# Patient Record
Sex: Male | Born: 1970 | Race: White | Marital: Married | Smoking: Never smoker
Health system: Northeastern US, Academic
[De-identification: ages and names within clinical notes are randomized; demographics above are authoritative.]

## PROBLEM LIST (undated history)

## (undated) DIAGNOSIS — G4730 Sleep apnea, unspecified: Secondary | ICD-10-CM

## (undated) DIAGNOSIS — I10 Essential (primary) hypertension: Secondary | ICD-10-CM

## (undated) DIAGNOSIS — Z9989 Dependence on other enabling machines and devices: Secondary | ICD-10-CM

## (undated) DIAGNOSIS — K219 Gastro-esophageal reflux disease without esophagitis: Secondary | ICD-10-CM

## (undated) DIAGNOSIS — T7840XA Allergy, unspecified, initial encounter: Secondary | ICD-10-CM

## (undated) DIAGNOSIS — K589 Irritable bowel syndrome without diarrhea: Secondary | ICD-10-CM

## (undated) DIAGNOSIS — E785 Hyperlipidemia, unspecified: Secondary | ICD-10-CM

## (undated) DIAGNOSIS — G4733 Obstructive sleep apnea (adult) (pediatric): Secondary | ICD-10-CM

## (undated) DIAGNOSIS — D126 Benign neoplasm of colon, unspecified: Secondary | ICD-10-CM

## (undated) DIAGNOSIS — I2510 Atherosclerotic heart disease of native coronary artery without angina pectoris: Secondary | ICD-10-CM

## (undated) DIAGNOSIS — R918 Other nonspecific abnormal finding of lung field: Secondary | ICD-10-CM

## (undated) DIAGNOSIS — R0989 Other specified symptoms and signs involving the circulatory and respiratory systems: Secondary | ICD-10-CM

## (undated) DIAGNOSIS — R197 Diarrhea, unspecified: Secondary | ICD-10-CM

## (undated) DIAGNOSIS — E7800 Pure hypercholesterolemia, unspecified: Secondary | ICD-10-CM

## (undated) HISTORY — PX: WISDOM TOOTH EXTRACTION: SHX21

## (undated) HISTORY — DX: Benign neoplasm of colon, unspecified: D12.6

## (undated) HISTORY — DX: Irritable bowel syndrome, unspecified: K58.9

## (undated) HISTORY — DX: Obstructive sleep apnea (adult) (pediatric): G47.33

## (undated) HISTORY — DX: Gastro-esophageal reflux disease without esophagitis: K21.9

## (undated) HISTORY — DX: Dependence on other enabling machines and devices: Z99.89

## (undated) HISTORY — DX: Hyperlipidemia, unspecified: E78.5

## (undated) HISTORY — DX: Allergy, unspecified, initial encounter: T78.40XA

## (undated) HISTORY — DX: Essential (primary) hypertension: I10

## (undated) HISTORY — DX: Sleep apnea, unspecified: G47.30

---

## 2000-01-09 HISTORY — PX: HIP SURGERY: SHX245

## 2008-10-17 HISTORY — PX: CERVICAL FUSION: SHX112

## 2009-10-17 HISTORY — PX: CERVICAL FUSION: SHX112

## 2009-10-18 ENCOUNTER — Ambulatory Visit (HOSPITAL_COMMUNITY): Admission: RE | Admit: 2009-10-18 | Discharge: 2009-10-19 | Payer: Self-pay | Admitting: Neurosurgery

## 2009-11-02 ENCOUNTER — Encounter: Admission: RE | Admit: 2009-11-02 | Discharge: 2009-11-02 | Payer: Self-pay | Admitting: Neurosurgery

## 2010-03-23 LAB — BASIC METABOLIC PANEL
CO2: 28 mEq/L (ref 19–32)
Chloride: 107 mEq/L (ref 96–112)
Creatinine, Ser: 0.9 mg/dL (ref 0.4–1.5)
GFR calc Af Amer: >60 mL/min (ref >60)
Potassium: 4.8 mEq/L (ref 3.5–5.1)
Sodium: 141 mEq/L (ref 135–145)

## 2010-03-23 LAB — CBC
Hemoglobin: 15.5 g/dL (ref 13.0–17.0)
MCH: 33 pg (ref 26.0–34.0)
MCV: 93 fL (ref 78.0–100.0)
Platelets: 266 10*3/uL (ref 150–400)
RBC: 4.69 MIL/uL (ref 4.22–5.81)
WBC: 7.5 10*3/uL (ref 4.0–10.5)

## 2010-07-25 ENCOUNTER — Encounter: Payer: Self-pay | Admitting: *Deleted

## 2010-07-25 ENCOUNTER — Emergency Department (HOSPITAL_COMMUNITY): Payer: PRIVATE HEALTH INSURANCE

## 2010-07-25 ENCOUNTER — Emergency Department (HOSPITAL_COMMUNITY)
Admission: EM | Admit: 2010-07-25 | Discharge: 2010-07-25 | Disposition: A | Discharge status: Home / Self Care | Payer: PRIVATE HEALTH INSURANCE | Attending: Emergency Medicine | Admitting: Emergency Medicine

## 2010-07-25 DIAGNOSIS — Z87891 Personal history of nicotine dependence: Secondary | ICD-10-CM | POA: Insufficient documentation

## 2010-07-25 DIAGNOSIS — M25461 Effusion, right knee: Secondary | ICD-10-CM

## 2010-07-25 DIAGNOSIS — M25469 Effusion, unspecified knee: Secondary | ICD-10-CM | POA: Insufficient documentation

## 2010-07-25 DIAGNOSIS — R52 Pain, unspecified: Secondary | ICD-10-CM

## 2010-07-25 DIAGNOSIS — Z809 Family history of malignant neoplasm, unspecified: Secondary | ICD-10-CM | POA: Insufficient documentation

## 2010-07-25 MED ORDER — HYDROCODONE-ACETAMINOPHEN 5-325 MG PO TABS: 1.0000 | ORAL_TABLET | ORAL | Status: AC | PRN

## 2010-07-25 MED ORDER — IBUPROFEN 800 MG PO TABS
800.0000 mg | ORAL_TABLET | Freq: Once | ORAL | Status: AC
  Administered 2010-07-25: 800 mg via ORAL
  Filled 2010-07-25: qty 1

## 2010-07-25 MED ORDER — HYDROCODONE-ACETAMINOPHEN 5-325 MG PO TABS
1.0000 | ORAL_TABLET | Freq: Once | ORAL | Status: AC
  Administered 2010-07-25: 1 via ORAL
  Filled 2010-07-25: qty 1

## 2010-07-25 MED ORDER — IBUPROFEN 800 MG PO TABS: 800.0000 mg | ORAL_TABLET | Freq: Three times a day (TID) | ORAL | Status: AC | PRN

## 2010-07-25 NOTE — ED Notes (Signed)
Pt c/o pain and swelling to his rt knee since yesterday. Denies injury. Pt states that he injured his knee years ago and about once a year it acts up.

## 2010-07-25 NOTE — ED Provider Notes (Signed)
History     Chief Complaint  Patient presents with  . Knee Pain   Patient is a 40 y.o. male presenting with knee pain. The history is provided by the patient.  Knee Pain This is a recurrent problem. The current episode started yesterday. The problem occurs constantly. The problem has been unchanged. Associated symptoms include arthralgias and joint swelling. Pertinent negatives include no abdominal pain, chest pain, congestion, fever, headaches, nausea, neck pain, numbness, rash, sore throat, vomiting or weakness. The symptoms are aggravated by bending and walking. He has tried nothing for the symptoms.    History reviewed. No pertinent past medical history.  Past Surgical History  Procedure Date  . Cervical fusion   . Hip surgery     lipoma removed    Family History  Problem Relation Age of Onset  . Cancer Father   . Hypertension Father     History  Substance Use Topics  . Smoking status: Former Games developer  . Smokeless tobacco: Not on file  . Alcohol Use: 2.4 oz/week    4 Cans of beer per week      Review of Systems  Constitutional: Negative for fever.  HENT: Negative for congestion, sore throat and neck pain.   Eyes: Negative.   Respiratory: Negative for chest tightness and shortness of breath.   Cardiovascular: Negative for chest pain.  Gastrointestinal: Negative for nausea, vomiting and abdominal pain.  Genitourinary: Negative.   Musculoskeletal: Positive for joint swelling, arthralgias and gait problem.  Skin: Negative.  Negative for rash and wound.  Neurological: Negative for dizziness, weakness, light-headedness, numbness and headaches.  Hematological: Negative.   Psychiatric/Behavioral: Negative.     Physical Exam  BP 129/92  Pulse 90  Temp(Src) 98.5 F (36.9 C) (Oral)  Resp 18  Ht 5\' 9"  (1.753 m)  Wt 204 lb (92.534 kg)  BMI 30.13 kg/m2  SpO2 98%  Physical Exam  Vitals reviewed. Constitutional: He is oriented to person, place, and time. He appears  well-developed and well-nourished.  HENT:  Head: Normocephalic and atraumatic.  Eyes: Conjunctivae are normal.  Neck: Normal range of motion.  Cardiovascular: Normal rate, regular rhythm, normal heart sounds and intact distal pulses.   Pulmonary/Chest: Effort normal and breath sounds normal. He has no wheezes.  Abdominal: Soft. Bowel sounds are normal. There is no tenderness.  Musculoskeletal:       Right knee: He exhibits decreased range of motion, swelling and effusion. He exhibits no deformity, no erythema, no LCL laxity and no MCL laxity. tenderness found.  Neurological: He is alert and oriented to person, place, and time.  Skin: Skin is warm and dry.  Psychiatric: He has a normal mood and affect.    ED Course  Procedures  MDM Crutches,  Knee immobilizer provided by RN.      Candis Musa, PA 07/25/10 1453

## 2010-07-26 NOTE — ED Provider Notes (Signed)
Medical screening examination/treatment/procedure(s) were performed by non-physician practitioner and as supervising physician I was immediately available for consultation/collaboration.   Aleksandr Pellow M Love Milbourne, MD 07/26/10 0629 

## 2010-08-08 ENCOUNTER — Ambulatory Visit (INDEPENDENT_AMBULATORY_CARE_PROVIDER_SITE_OTHER): Payer: PRIVATE HEALTH INSURANCE | Admitting: Orthopedic Surgery

## 2010-08-08 DIAGNOSIS — M25469 Effusion, unspecified knee: Secondary | ICD-10-CM

## 2010-08-08 DIAGNOSIS — M25569 Pain in unspecified knee: Secondary | ICD-10-CM

## 2010-08-08 DIAGNOSIS — M238X9 Other internal derangements of unspecified knee: Secondary | ICD-10-CM

## 2010-08-08 DIAGNOSIS — M23329 Other meniscus derangements, posterior horn of medial meniscus, unspecified knee: Secondary | ICD-10-CM

## 2010-08-08 DIAGNOSIS — M25462 Effusion, left knee: Secondary | ICD-10-CM

## 2010-08-08 DIAGNOSIS — M2420 Disorder of ligament, unspecified site: Secondary | ICD-10-CM

## 2010-08-08 NOTE — Patient Instructions (Signed)
You have received a steroid shot. 15% of patients experience increased pain at the injection site with in the next 24 hours. This is best treated with ice and tylenol extra strength 2 tabs every 8 hours. If you are still having pain please call the office.    

## 2010-08-09 ENCOUNTER — Encounter: Payer: Self-pay | Admitting: Orthopedic Surgery

## 2010-08-09 DIAGNOSIS — M25462 Effusion, left knee: Secondary | ICD-10-CM | POA: Insufficient documentation

## 2010-08-09 DIAGNOSIS — M238X9 Other internal derangements of unspecified knee: Secondary | ICD-10-CM | POA: Insufficient documentation

## 2010-08-09 DIAGNOSIS — M25569 Pain in unspecified knee: Secondary | ICD-10-CM | POA: Insufficient documentation

## 2010-08-09 MED ORDER — METHYLPREDNISOLONE ACETATE 40 MG/ML IJ SUSP: 40.0000 mg | Freq: Once | INTRAMUSCULAR | Status: DC

## 2010-08-09 NOTE — Progress Notes (Signed)
   New patient  Chief complaint RIGHT knee pain and swelling  This is a 40 year old male who injured his RIGHT knee 6-7 years ago jumping off a truck with 100 pound sack of corn on his shoulders.  He felt immediate pain and swelling in his knee "blew up" he was unable to move it for several days and eventually his symptoms resolved and he went back to his normal activities.  However, since that time he will have periodic swelling of the knee and loss of motion.  It usually with rest and anti-inflammatories his symptoms will resolve in a couple of days.  However on this occasion he had to go to the emergency room because his symptoms did not get better and he's had pain and swelling and symptoms of giving out of the knee over the last 2 weeks  Review of systems respiratory he reports frequent and intermittent cough, musculoskeletal as stated, neurologic occasional dizziness.  Cardiovascular GI and GU symptoms are negative  Physical examination Vital signs are stable as recorded  General appearance is normal  The patient is alert and oriented x3  The patient's mood and affect are normal  Gait assessment: limping The cardiovascular exam reveals normal pulses and temperature without edema swelling.  The lymphatic system is negative for palpable lymph nodes  The sensory exam is normal.  There are no pathologic reflexes.  Balance is normal.   Exam of the right knee Inspection swelling of the knee  Range of motion 5-125 Stability ? Feels lax but pain and swelling limit exam  Strength normal Skin normal   Left knee exam is normal   xrays of the knee are from [facility]: they are normal     Knee Arthrocentesis with Injection Procedure Note  Pre-operative Diagnosis: right knee effusion  Post-operative Diagnosis: same  Indications: Unexplained joint effusion and Diagnostic  Anesthesia:ethyl chloride  Procedure Details   Verbal consent was obtained for the procedure. The joint was  prepped with Betadine and a small wheel of anesthetic was injected into the subcutaneous tissue. A 18 gauge needle was inserted into the superior aspect of the joint from a lateral approach. 0 ml of  fluid was removed from the joint. 4 ml 1% lidocaine and 1 ml of depomedrol was then injected into the joint through the same needle. The needle was removed and the area cleansed and dressed.  Complications:  None; patient tolerated the procedure well.

## 2010-08-18 ENCOUNTER — Telehealth: Payer: Self-pay | Admitting: Radiology

## 2010-08-18 NOTE — Telephone Encounter (Signed)
I called and left a message for the patient with his MRI appointment at [facility] on 09-01-10 at 9:45. Patient has [employer], authorization # [id number] and it expires on 09-04-10. Patient will follow up back here in our office.

## 2010-08-22 ENCOUNTER — Ambulatory Visit: Payer: PRIVATE HEALTH INSURANCE | Admitting: Orthopedic Surgery

## 2010-09-01 ENCOUNTER — Ambulatory Visit (HOSPITAL_COMMUNITY)
Admission: RE | Admit: 2010-09-01 | Discharge: 2010-09-01 | Disposition: A | Discharge status: Home / Self Care | Payer: PRIVATE HEALTH INSURANCE | Source: Ambulatory Visit | Attending: Orthopedic Surgery | Admitting: Orthopedic Surgery

## 2010-09-01 DIAGNOSIS — M25469 Effusion, unspecified knee: Secondary | ICD-10-CM | POA: Insufficient documentation

## 2010-09-01 DIAGNOSIS — M898X9 Other specified disorders of bone, unspecified site: Secondary | ICD-10-CM | POA: Insufficient documentation

## 2010-09-01 DIAGNOSIS — M25569 Pain in unspecified knee: Secondary | ICD-10-CM | POA: Insufficient documentation

## 2010-09-07 ENCOUNTER — Encounter: Payer: Self-pay | Admitting: Orthopedic Surgery

## 2010-09-07 ENCOUNTER — Ambulatory Visit: Payer: PRIVATE HEALTH INSURANCE | Admitting: Orthopedic Surgery

## 2010-09-21 ENCOUNTER — Ambulatory Visit (INDEPENDENT_AMBULATORY_CARE_PROVIDER_SITE_OTHER): Payer: PRIVATE HEALTH INSURANCE | Admitting: Orthopedic Surgery

## 2010-09-21 ENCOUNTER — Encounter: Payer: Self-pay | Admitting: Orthopedic Surgery

## 2010-09-21 DIAGNOSIS — M25569 Pain in unspecified knee: Secondary | ICD-10-CM

## 2010-09-21 NOTE — Progress Notes (Signed)
RIGHT knee pain  Recurrent swelling  Send patient for MRI MRI normal  Discussed this with patient  I am a shot or oral anti-inflammatory.  He requested some Vicodin along with the anti-inflammatory.  We gave him this medication will come back at any time follow up as needed  MRI was reviewed and the report was reviewed and the report was normal and I agree that it was normal

## 2010-09-21 NOTE — Patient Instructions (Signed)
Take medication as needed  

## 2011-12-18 ENCOUNTER — Encounter: Payer: Self-pay | Admitting: Gastroenterology

## 2011-12-27 ENCOUNTER — Ambulatory Visit: Payer: Managed Care, Other (non HMO) | Attending: Physician Assistant | Admitting: Sleep Medicine

## 2011-12-27 ENCOUNTER — Other Ambulatory Visit: Payer: Self-pay

## 2011-12-27 VITALS — Ht 69.0 in | Wt 206.0 lb

## 2011-12-27 DIAGNOSIS — G4730 Sleep apnea, unspecified: Secondary | ICD-10-CM | POA: Insufficient documentation

## 2011-12-28 NOTE — Procedures (Signed)
[facility] Nell Schrack A. Gerilyn Pilgrim, MD     [email or web address]        John Knox, John Knox             ACCOUNT NO.:  [id number]  MEDICAL RECORD NO.:  [id number]          PATIENT TYPE:  OUT  LOCATION:  SLEEP LAB                     FACILITY:  [facility]  PHYSICIAN:  Audery Wassenaar A. Gerilyn Pilgrim, M.D. DATE OF BIRTH:  31-Aug-1970  DATE OF STUDY:  12/27/2011                           NOCTURNAL POLYSOMNOGRAM  REFERRING PHYSICIAN:  Horald Pollen  REFERRING PROVIDER:  Helene Kelp, PA-C  INDICATION:  A 41 year old presents with hypersomnia, fatigue, and snoring.  The study is being done to evaluate for obstructive sleep apnea syndrome.  MEDICATIONS:  Crestor.  EPWORTH SLEEPINESS SCALE:  7.  BMI:  30.  ARCHITECTURAL SUMMARY:  Total recording time is 361 minutes.  Sleep efficiency 91%.  Sleep latency 4.5 minutes.  REM latency 85 minutes. This is a split night recording with initial portion being a diagnostic and second portion a titration recording.  RESPIRATORY SUMMARY:  Baseline oxygen saturation 97%, lowest saturation 80% during non-REM sleep.  Diagnostic AHI is 27.  The patient was placed on positive pressures between 6 and 12.  Optimal pressure is 12 with resolution of obstructive events and snoring.  LIMB MOVEMENT SUMMARY:  PLM index 0.  ELECTROCARDIOGRAM SUMMARY:  Average heart rate is 76 with no significant dysrhythmias observed.  IMPRESSION:  Moderate obstructive sleep apnea syndrome, which responds well to a CPAP of 12.  Thanks for this referral.    Kaavya Puskarich A. Gerilyn Pilgrim, M.D.    KAD/MEDQ  D:  12/28/2011 10:38:42  T:  12/28/2011 10:49:20  Job:  [id number]

## 2012-01-08 ENCOUNTER — Ambulatory Visit (AMBULATORY_SURGERY_CENTER): Payer: Managed Care, Other (non HMO)

## 2012-01-08 VITALS — Ht 69.0 in | Wt 211.0 lb

## 2012-01-08 DIAGNOSIS — Z800 Family history of malignant neoplasm of digestive organs: Secondary | ICD-10-CM

## 2012-01-08 DIAGNOSIS — Z1211 Encounter for screening for malignant neoplasm of colon: Secondary | ICD-10-CM

## 2012-01-08 MED ORDER — MOVIPREP 100 G PO SOLR: ORAL | Status: DC

## 2012-01-10 ENCOUNTER — Encounter: Payer: Self-pay | Admitting: Gastroenterology

## 2012-01-23 ENCOUNTER — Encounter: Payer: Self-pay | Admitting: Gastroenterology

## 2012-01-23 ENCOUNTER — Ambulatory Visit (AMBULATORY_SURGERY_CENTER): Payer: Managed Care, Other (non HMO) | Admitting: Gastroenterology

## 2012-01-23 VITALS — BP 120/57 | HR 82 | Temp 97.2°F | Resp 14 | Ht 69.0 in | Wt 211.0 lb

## 2012-01-23 DIAGNOSIS — D126 Benign neoplasm of colon, unspecified: Secondary | ICD-10-CM

## 2012-01-23 DIAGNOSIS — Z1211 Encounter for screening for malignant neoplasm of colon: Secondary | ICD-10-CM

## 2012-01-23 DIAGNOSIS — Z800 Family history of malignant neoplasm of digestive organs: Secondary | ICD-10-CM

## 2012-01-23 MED ORDER — SODIUM CHLORIDE 0.9 % IV SOLN: 500.0000 mL | INTRAVENOUS | Status: DC

## 2012-01-23 NOTE — Progress Notes (Signed)
Called to room to assist during endoscopic procedure.  Patient ID and intended procedure confirmed with present staff. Received instructions for my participation in the procedure from the performing physician. ewm 

## 2012-01-23 NOTE — Progress Notes (Signed)
Patient did not experience any of the following events: a burn prior to discharge; a fall within the facility; wrong site/side/patient/procedure/implant event; or a hospital transfer or hospital admission upon discharge from the facility. (G8907) Patient did not have preoperative order for IV antibiotic SSI prophylaxis. (G8918)  

## 2012-01-23 NOTE — Op Note (Signed)
[facility] [address] [place], [address]   COLONOSCOPY PROCEDURE REPORT  PATIENT: John Knox, John Knox  MR#: [id number] BIRTHDATE: 1971-01-01 , 41  yrs. old GENDER: Male ENDOSCOPIST: Mardella Layman, MD, [facility] REFERRED BY:  Rudi Heap, M.D. PROCEDURE DATE:  01/23/2012 PROCEDURE:   Colonoscopy with snare polypectomy ASA CLASS:   Class II INDICATIONS:Patient's immediate family history of colon cancer. MEDICATIONS: propofol (Diprivan) 350mg  IV  DESCRIPTION OF PROCEDURE:   After the risks and benefits and of the procedure were explained, informed consent was obtained.  A digital rectal exam revealed no abnormalities of the rectum.    The LB CF-[id number]  endoscope was introduced through the anus and advanced to the cecum, which was identified by both the appendix and ileocecal valve .  The quality of the prep was excellent, using MoviPrep .  The instrument was then slowly withdrawn as the colon was fully examined.     COLON FINDINGS: A fungating pedunculated polyp ranging between 5-45mm in size was found in the sigmoid colon.  A polypectomy was performed using snare cautery.  The resection was complete and the polyp tissue was completely retrieved.   A diminutive smooth flat polyp was found in the rectum.  A polypectomy was performed using snare cautery.  The resection was complete and the polyp tissue was not retrieved. Other wise normal exam.    Retroflexed views revealed no abnormalities.     The scope was then withdrawn from the patient and the procedure completed.  COMPLICATIONS: There were no complications. ENDOSCOPIC IMPRESSION: 1.   Pedunculated polyp ranging between 5-16mm in size was found in the sigmoid colon; polypectomy was performed using snare cautery 2.   Diminutive flat polyp was found in the rectum; polypectomy was performed using snare cautery  RECOMMENDATIONS:  1  Await pathology results 2.  Repeat Colonoscopy in 3  years.   REPEAT EXAM:  cc:  _______________________________ eSignedMardella Layman, MD, [facility] 01/23/2012 2:42 PM

## 2012-01-23 NOTE — Progress Notes (Signed)
Lidocaine-40mg IV prior to Propofol InductionPropofol given over incremental dosages 

## 2012-01-23 NOTE — Patient Instructions (Addendum)
Discharge instructions given with verbal understanding. Handout on polyps given. Resume previous medications. YOU HAD AN ENDOSCOPIC PROCEDURE TODAY AT THE [facility]: Refer to the procedure report that was given to you for any specific questions about what was found during the examination.  If the procedure report does not answer your questions, please call your gastroenterologist to clarify.  If you requested that your care partner not be given the details of your procedure findings, then the procedure report has been included in a sealed envelope for you to review at your convenience later.  YOU SHOULD EXPECT: Some feelings of bloating in the abdomen. Passage of more gas than usual.  Walking can help get rid of the air that was put into your GI tract during the procedure and reduce the bloating. If you had a lower endoscopy (such as a colonoscopy or flexible sigmoidoscopy) you may notice spotting of blood in your stool or on the toilet paper. If you underwent a bowel prep for your procedure, then you may not have a normal bowel movement for a few days.  DIET: Your first meal following the procedure should be a light meal and then it is ok to progress to your normal diet.  A half-sandwich or bowl of soup is an example of a good first meal.  Heavy or fried foods are harder to digest and may make you feel nauseous or bloated.  Likewise meals heavy in dairy and vegetables can cause extra gas to form and this can also increase the bloating.  Drink plenty of fluids but you should avoid alcoholic beverages for 24 hours.  ACTIVITY: Your care partner should take you home directly after the procedure.  You should plan to take it easy, moving slowly for the rest of the day.  You can resume normal activity the day after the procedure however you should NOT DRIVE or use heavy machinery for 24 hours (because of the sedation medicines used during the test).    SYMPTOMS TO REPORT IMMEDIATELY: A  gastroenterologist can be reached at any hour.  During normal business hours, 8:30 AM to 5:00 PM Monday through Friday, call [phone].  After hours and on weekends, please call the GI answering service at [phone] who will take a message and have the physician on call contact you.   Following lower endoscopy (colonoscopy or flexible sigmoidoscopy):  Excessive amounts of blood in the stool  Significant tenderness or worsening of abdominal pains  Swelling of the abdomen that is new, acute  Fever of 100F or higher  FOLLOW UP: If any biopsies were taken you will be contacted by phone or by letter within the next 1-3 weeks.  Call your gastroenterologist if you have not heard about the biopsies in 3 weeks.  Our staff will call the home number listed on your records the next business day following your procedure to check on you and address any questions or concerns that you may have at that time regarding the information given to you following your procedure. This is a courtesy call and so if there is no answer at the home number and we have not heard from you through the emergency physician on call, we will assume that you have returned to your regular daily activities without incident.  SIGNATURES/CONFIDENTIALITY: You and/or your care partner have signed paperwork which will be entered into your electronic medical record.  These signatures attest to the fact that that the information above on your After Visit Summary has   been reviewed and is understood.  Full responsibility of the confidentiality of this discharge information lies with you and/or your care-partner. 

## 2012-01-24 ENCOUNTER — Telehealth: Payer: Self-pay

## 2012-01-24 NOTE — Telephone Encounter (Signed)
Left message on answering machine. 

## 2012-01-29 ENCOUNTER — Encounter: Payer: Self-pay | Admitting: Gastroenterology

## 2012-03-28 ENCOUNTER — Ambulatory Visit (INDEPENDENT_AMBULATORY_CARE_PROVIDER_SITE_OTHER): Payer: Managed Care, Other (non HMO) | Admitting: Nurse Practitioner

## 2012-03-28 ENCOUNTER — Encounter: Payer: Self-pay | Admitting: Nurse Practitioner

## 2012-03-28 VITALS — BP 134/96 | HR 85 | Temp 97.3°F | Ht 69.0 in | Wt 216.0 lb

## 2012-03-28 DIAGNOSIS — J329 Chronic sinusitis, unspecified: Secondary | ICD-10-CM

## 2012-03-28 MED ORDER — FLUTICASONE PROPIONATE 50 MCG/ACT NA SUSP: 2.0000 | Freq: Every day | NASAL | Status: DC

## 2012-03-28 NOTE — Patient Instructions (Addendum)
STOP AFRIN!  Sinusitis Sinusitis is redness, soreness, and swelling (inflammation) of the paranasal sinuses. Paranasal sinuses are air pockets within the bones of your face (beneath the eyes, the middle of the forehead, or above the eyes). In healthy paranasal sinuses, mucus is able to drain out, and air is able to circulate through them by way of your nose. However, when your paranasal sinuses are inflamed, mucus and air can become trapped. This can allow bacteria and other germs to grow and cause infection. Sinusitis can develop quickly and last only a short time (acute) or continue over a long period (chronic). Sinusitis that lasts for more than 12 weeks is considered chronic.  CAUSES  Causes of sinusitis include:  Allergies.  Structural abnormalities, such as displacement of the cartilage that separates your nostrils (deviated septum), which can decrease the air flow through your nose and sinuses and affect sinus drainage.  Functional abnormalities, such as when the small hairs (cilia) that line your sinuses and help remove mucus do not work properly or are not present. SYMPTOMS  Symptoms of acute and chronic sinusitis are the same. The primary symptoms are pain and pressure around the affected sinuses. Other symptoms include:  Upper toothache.  Earache.  Headache.  Bad breath.  Decreased sense of smell and taste.  A cough, which worsens when you are lying flat.  Fatigue.  Fever.  Thick drainage from your nose, which often is green and may contain pus (purulent).  Swelling and warmth over the affected sinuses. DIAGNOSIS  Your caregiver will perform a physical exam. During the exam, your caregiver may:  Look in your nose for signs of abnormal growths in your nostrils (nasal polyps).  Tap over the affected sinus to check for signs of infection.  View the inside of your sinuses (endoscopy) with a special imaging device with a light attached (endoscope), which is inserted  into your sinuses. If your caregiver suspects that you have chronic sinusitis, one or more of the following tests may be recommended:  Allergy tests.  Nasal culture A sample of mucus is taken from your nose and sent to a lab and screened for bacteria.  Nasal cytology A sample of mucus is taken from your nose and examined by your caregiver to determine if your sinusitis is related to an allergy. TREATMENT  Most cases of acute sinusitis are related to a viral infection and will resolve on their own within 10 days. Sometimes medicines are prescribed to help relieve symptoms (pain medicine, decongestants, nasal steroid sprays, or saline sprays).  However, for sinusitis related to a bacterial infection, your caregiver will prescribe antibiotic medicines. These are medicines that will help kill the bacteria causing the infection.  Rarely, sinusitis is caused by a fungal infection. In theses cases, your caregiver will prescribe antifungal medicine. For some cases of chronic sinusitis, surgery is needed. Generally, these are cases in which sinusitis recurs more than 3 times per year, despite other treatments. HOME CARE INSTRUCTIONS   Drink plenty of water. Water helps thin the mucus so your sinuses can drain more easily.  Use a humidifier.  Inhale steam 3 to 4 times a day (for example, sit in the bathroom with the shower running).  Apply a warm, moist washcloth to your face 3 to 4 times a day, or as directed by your caregiver.  Use saline nasal sprays to help moisten and clean your sinuses.  Take over-the-counter or prescription medicines for pain, discomfort, or fever only as directed by your caregiver.  SEEK IMMEDIATE MEDICAL CARE IF:  You have increasing pain or severe headaches.  You have nausea, vomiting, or drowsiness.  You have swelling around your face.  You have vision problems.  You have a stiff neck.  You have difficulty breathing. MAKE SURE YOU:   Understand these  instructions.  Will watch your condition.  Will get help right away if you are not doing well or get worse. Document Released: 12/25/2004 Document Revised: 03/19/2011 Document Reviewed: 01/09/2011 [facility] Patient Information 2013 [place], [place].

## 2012-03-28 NOTE — Progress Notes (Signed)
  Subjective:    Patient ID: John Knox, male    DOB: 1970-05-09, 42 y.o.   MRN: [id number]  HPI Patient in complaining of nasal congestion . Started 62month. Has gotten unchanged since started. Marland Kitchen He has tried Afrin OTC without relief    Review of Systems  Constitutional: Negative.   HENT: Positive for congestion.   Respiratory: Negative.   Cardiovascular: Negative.        Objective:   Physical Exam  Constitutional: He appears well-developed and well-nourished.  HENT:  Head: Normocephalic.  Right Ear: External ear normal.  Left Ear: External ear normal.  Nose: Mucosal edema present. Right sinus exhibits no maxillary sinus tenderness and no frontal sinus tenderness. Left sinus exhibits no maxillary sinus tenderness and no frontal sinus tenderness.  Tonsils 2+ bil  Cardiovascular: Normal rate, regular rhythm and normal heart sounds.   Pulmonary/Chest: Effort normal and breath sounds normal.  Skin: Skin is warm and dry.    BP 134/96  Pulse 85  Temp(Src) 97.3 F (36.3 C) (Oral)  Ht 5\' 9"  (1.753 m)  Wt 216 lb (97.977 kg)  BMI 31.88 kg/m2       Assessment & Plan:  Chronic Sinusitis  STOP AFRIN!  Force fluids  Zyrtec D OTC  Flonase nasal spray as Rx  Mary-Margaret Daphine Deutscher, FNP

## 2012-08-04 ENCOUNTER — Telehealth: Payer: Self-pay

## 2012-08-04 DIAGNOSIS — J329 Chronic sinusitis, unspecified: Secondary | ICD-10-CM

## 2012-08-04 NOTE — Telephone Encounter (Signed)
Wants a referral to ENT   Has same congestion as he came in for before  It is severe

## 2012-08-04 NOTE — Telephone Encounter (Signed)
referrral made to ENT

## 2012-08-05 NOTE — Telephone Encounter (Signed)
Patient aware.

## 2012-12-12 ENCOUNTER — Encounter: Payer: Self-pay | Admitting: Physician Assistant

## 2012-12-12 ENCOUNTER — Ambulatory Visit (INDEPENDENT_AMBULATORY_CARE_PROVIDER_SITE_OTHER): Payer: PRIVATE HEALTH INSURANCE | Admitting: Physician Assistant

## 2012-12-12 VITALS — BP 130/96 | HR 90 | Temp 98.6°F | Wt 219.8 lb

## 2012-12-12 DIAGNOSIS — J020 Streptococcal pharyngitis: Secondary | ICD-10-CM

## 2012-12-12 DIAGNOSIS — J029 Acute pharyngitis, unspecified: Secondary | ICD-10-CM

## 2012-12-12 DIAGNOSIS — J039 Acute tonsillitis, unspecified: Secondary | ICD-10-CM

## 2012-12-12 MED ORDER — METHYLPREDNISOLONE ACETATE 80 MG/ML IJ SUSP
80.0000 mg | Freq: Once | INTRAMUSCULAR | Status: AC
  Administered 2012-12-12: 80 mg via INTRAMUSCULAR

## 2012-12-12 MED ORDER — AMOXICILLIN 875 MG PO TABS: 875.0000 mg | ORAL_TABLET | Freq: Two times a day (BID) | ORAL | Status: DC

## 2012-12-12 NOTE — Patient Instructions (Addendum)
Drink plenty of fluids. After starting antibiotic therapy, you will be contagious for 24 hours. F/u in 1 week for reassessment of tonsils and possible referral to ENT.    Methylprednisolone Suspension for Injection What is this medicine? METHYLPREDNISOLONE (meth ill pred NISS oh lone) is a corticosteroid. It is commonly used to treat inflammation of the skin, joints, lungs, and other organs. Common conditions treated include asthma, allergies, and arthritis. It is also used for other conditions, such as blood disorders and diseases of the adrenal glands. This medicine may be used for other purposes; ask your health care provider or pharmacist if you have questions. COMMON BRAND NAME(S): Depmedalone-40, Depmedalone-80 , Depo-Medrol What should I tell my health care provider before I take this medicine? They need to know if you have any of these conditions: -cataracts or glaucoma -Cushings -heart disease -high blood pressure -infection including tuberculosis -low calcium or potassium levels in the blood -recent surgery -seizures -stomach or intestinal disease, including colitis -threadworms -thyroid problems -an unusual or allergic reaction to methylprednisolone, corticosteroids, benzyl alcohol, other medicines, foods, dyes, or preservatives -pregnant or trying to get pregnant -breast-feeding How should I use this medicine? This medicine is for injection into a muscle, joint, or other tissue. It is given by a health care professional in a hospital or clinic setting. Talk to your pediatrician regarding the use of this medicine in children. While this drug may be prescribed for selected conditions, precautions do apply. Overdosage: If you think you have taken too much of this medicine contact a poison control center or emergency room at once. NOTE: This medicine is only for you. Do not share this medicine with others. What if I miss a dose? This does not apply. What may interact with this  medicine? Do not take this medicine with any of the following medications: -mifepristone This medicine may also interact with the following medications: -aspirin and aspirin-like medicines -cyclosporin -ketoconazole -phenobarbital -phenytoin -rifampin -tacrolimus -troleandomycin -vaccines -warfarin This list may not describe all possible interactions. Give your health care provider a list of all the medicines, herbs, non-prescription drugs, or dietary supplements you use. Also tell them if you smoke, drink alcohol, or use illegal drugs. Some items may interact with your medicine. What should I watch for while using this medicine? Visit your doctor or health care professional for regular checks on your progress. If you are taking this medicine for a long time, carry an identification card with your name and address, the type and dose of your medicine, and your doctor's name and address. The medicine may increase your risk of getting an infection. Stay away from people who are sick. Tell your doctor or health care professional if you are around anyone with measles or chickenpox. You may need to avoid some vaccines. Talk to your health care provider for more information. If you are going to have surgery, tell your doctor or health care professional that you have taken this medicine within the last twelve months. Ask your doctor or health care professional about your diet. You may need to lower the amount of salt you eat. The medicine can increase your blood sugar. If you are a diabetic check with your doctor if you need help adjusting the dose of your diabetic medicine. What side effects may I notice from receiving this medicine? Side effects that you should report to your doctor or health care professional as soon as possible: -allergic reactions like skin rash, itching or hives, swelling of the face, lips, or  tongue -bloody or tarry stools -changes in vision -eye pain or bulging  eyes -fever, sore throat, sneezing, cough, or other signs of infection, wounds that will not heal -increased thirst -irregular heartbeat -muscle cramps -pain in hips, back, ribs, arms, shoulders, or legs -swelling of the ankles, feet, hands -trouble passing urine or change in the amount of urine -unusual bleeding or bruising -unusually weak or tired -weight gain or weight loss Side effects that usually do not require medical attention (report to your doctor or health care professional if they continue or are bothersome): -changes in emotions or moods -constipation or diarrhea -headache -irritation at site where injected -nausea, vomiting -skin problems, acne, thin and shiny skin -trouble sleeping -unusual hair growth on the face or body This list may not describe all possible side effects. Call your doctor for medical advice about side effects. You may report side effects to FDA at 1-800-FDA-1088. Where should I keep my medicine? This drug is given in a hospital or clinic and will not be stored at home. NOTE: This sheet is a summary. It may not cover all possible information. If you have questions about this medicine, talk to your doctor, pharmacist, or health care provider.  2014, Elsevier/Gold Standard. (2011-09-25 11:37:56)

## 2012-12-16 DIAGNOSIS — J039 Acute tonsillitis, unspecified: Secondary | ICD-10-CM | POA: Insufficient documentation

## 2012-12-16 NOTE — Progress Notes (Signed)
   Subjective:    Patient ID: John Knox, male    DOB: 1970/10/29, 42 y.o.   MRN: [id number]  HPI42 y/o male presents for sore throat x 3-4 days. Recently got back from a business trip and had possible exposure on the plane. No aggravating or relieving factors. States that he has been hospitalized for tonsillitis in the past and has always had large tonsils since he was a child.     Review of Systems  Constitutional: Positive for chills, diaphoresis, activity change and fatigue.  HENT: Positive for congestion (nasal), sore throat and trouble swallowing. Negative for drooling, ear discharge, ear pain, facial swelling, hearing loss, mouth sores, nosebleeds, postnasal drip, rhinorrhea, sinus pressure, sneezing, tinnitus and voice change.   Eyes: Negative.   Respiratory: Negative.   Cardiovascular: Negative.   Gastrointestinal: Negative.   Endocrine: Negative.   Genitourinary: Negative.   Musculoskeletal: Negative.   Neurological: Negative for headaches.       Objective:   Physical Exam  Vitals reviewed. Constitutional: He is oriented to person, place, and time. He appears well-developed and well-nourished. No distress.  HENT:  Head: Normocephalic and atraumatic.  Right Ear: External ear normal.  Left Ear: External ear normal.  Mouth/Throat: Uvula is midline. Posterior oropharyngeal edema and posterior oropharyngeal erythema present. No oropharyngeal exudate.  Tonsillar hypertrophy 2-3+ bilaterally with erythema Preauricular node TTP bilaterally   Rapid Strep Positive  Neck: No tracheal deviation present. No thyromegaly present.  Lymphadenopathy:    He has no cervical adenopathy.  Neurological: He is alert and oriented to person, place, and time.  Skin: No rash noted. He is not diaphoretic. No erythema. No pallor.  Psychiatric: He has a normal mood and affect.          Assessment & Plan:  1. Streptococcal Pharyngitis with tonsilar hypertrophy bilaterally: Depomedrol  injection 80mg  for tonsillary hypertrophy given in office. Amoxicillin 875 mg BID x 10 days. Flonase for chronic nasal congestion. Advised patient that he would be contagious x 24 hrs after starting antibiotic due to his concerns over being at his daughters birthday party. I would like to reassesshim in 1 week in reference to his tonsillitis for possible referral to ENT.

## 2012-12-19 ENCOUNTER — Ambulatory Visit: Payer: PRIVATE HEALTH INSURANCE | Admitting: Physician Assistant

## 2013-06-26 ENCOUNTER — Telehealth: Payer: Self-pay | Admitting: Gastroenterology

## 2013-06-26 NOTE — Telephone Encounter (Signed)
Spoke with patient's wife. She states he travels during the week. He states his "gut hurts."  He also reports bowel movements have started alternating constipation to diarrhea. He also is feeling tired all the time. Father had colon cancer stage 4. He is concerned. Scheduled with Nicoletta Ba, PA on 07/06/13 at 2:30 PM(when he is in town)

## 2013-07-06 ENCOUNTER — Ambulatory Visit (INDEPENDENT_AMBULATORY_CARE_PROVIDER_SITE_OTHER): Payer: PRIVATE HEALTH INSURANCE | Admitting: Physician Assistant

## 2013-07-06 ENCOUNTER — Other Ambulatory Visit (INDEPENDENT_AMBULATORY_CARE_PROVIDER_SITE_OTHER): Payer: PRIVATE HEALTH INSURANCE

## 2013-07-06 ENCOUNTER — Encounter: Payer: Self-pay | Admitting: Physician Assistant

## 2013-07-06 VITALS — BP 122/84 | HR 68 | Ht 69.0 in | Wt 216.6 lb

## 2013-07-06 DIAGNOSIS — R1032 Left lower quadrant pain: Secondary | ICD-10-CM

## 2013-07-06 DIAGNOSIS — R194 Change in bowel habit: Secondary | ICD-10-CM

## 2013-07-06 DIAGNOSIS — R198 Other specified symptoms and signs involving the digestive system and abdomen: Secondary | ICD-10-CM

## 2013-07-06 DIAGNOSIS — Z86010 Personal history of colonic polyps: Secondary | ICD-10-CM

## 2013-07-06 DIAGNOSIS — Z800 Family history of malignant neoplasm of digestive organs: Secondary | ICD-10-CM

## 2013-07-06 DIAGNOSIS — D126 Benign neoplasm of colon, unspecified: Secondary | ICD-10-CM

## 2013-07-06 LAB — HIGH SENSITIVITY CRP: CRP, High Sensitivity: 4.31 mg/L (ref 0.000–5.000)

## 2013-07-06 LAB — CBC WITH DIFFERENTIAL/PLATELET
BASOS ABS: 0.1 10*3/uL (ref 0.0–0.1)
Basophils Relative: 0.8 % (ref 0.0–3.0)
EOS ABS: 0.2 10*3/uL (ref 0.0–0.7)
Eosinophils Relative: 1.4 % (ref 0.0–5.0)
HEMATOCRIT: 45.1 % (ref 39.0–52.0)
HEMOGLOBIN: 15.5 g/dL (ref 13.0–17.0)
LYMPHS ABS: 2.4 10*3/uL (ref 0.7–4.0)
Lymphocytes Relative: 15.8 % (ref 12.0–46.0)
MCHC: 34.3 g/dL (ref 30.0–36.0)
MCV: 92.2 fl (ref 78.0–100.0)
Monocytes Absolute: 1 10*3/uL (ref 0.1–1.0)
Monocytes Relative: 6.5 % (ref 3.0–12.0)
NEUTROS ABS: 11.4 10*3/uL — AB (ref 1.4–7.7)
Neutrophils Relative %: 75.5 % (ref 43.0–77.0)
Platelets: 328 10*3/uL (ref 150.0–400.0)
RBC: 4.9 Mil/uL (ref 4.22–5.81)
RDW: 13.7 % (ref 11.5–15.5)
WBC: 15.1 10*3/uL — ABNORMAL HIGH (ref 4.0–10.5)

## 2013-07-06 LAB — TSH: TSH: 1.36 u[IU]/mL (ref 0.35–4.50)

## 2013-07-06 NOTE — Progress Notes (Addendum)
Subjective:    Patient ID: John Knox, male    DOB: Oct 31, 1970, 43 y.o.   MRN: [id number]  HPI John Knox is a pleasant 43 year old white male known previously to Dr. Sharlett Iles who had undergone colonoscopy in January of 2014 for her screening do 2 family history of colon cancer the patient's father diagnosed in his 1s. Patient was found to have a pedunculated polyp in the sigmoid colon 5-9 mm and this was removed he had a very small polyp flat in the rectum as well. Path from the sigmoid polyp consistent with a tubular adenoma no high-grade dysplasia and a diminutive rectal polyp was cauterized and not retrieved. Patient comes in today with complaints of generalized fatigue over the past several months. He travels on a regular basis for his employment and has 2 small children at home. [place]  frequent basis and says that he cannot eat or sleep with a regular pattern. He has been noticing abdominal discomfort particularly postprandially over the past 3-4 months. He has also noticed alternating bowel habits with frequent loose stools and some urgency and loose stools postprandially. His abdominal pain has been in his mid to lower abdomen and more on the left side. His appetite has been good his weight has been stable has not noted any melena or hematochezia. No regular aspirin or NSAIDs. He says that about taking any medications. His primary concern is of abdominal pain and worry about colon cancer given his father's history    Review of Systems  Constitutional: Negative.   HENT: Negative.   Respiratory: Negative.   Cardiovascular: Negative.   Gastrointestinal: Positive for abdominal pain, diarrhea and constipation.  Endocrine: Negative.   Genitourinary: Negative.   Musculoskeletal: Negative.   Allergic/Immunologic: Negative.   Neurological: Negative.   Hematological: Negative.   Psychiatric/Behavioral: Negative.    Outpatient Prescriptions Prior to Visit    Medication Sig Dispense Refill  . fluticasone (FLONASE) 50 MCG/ACT nasal spray Place 2 sprays into the nose daily.  16 g  6  . amoxicillin (AMOXIL) 875 MG tablet Take 1 tablet (875 mg total) by mouth 2 (two) times daily.  20 tablet  0   No facility-administered medications prior to visit.      No Known Allergies Patient Active Problem List   Diagnosis Date Noted  . H/O adenomatous polyp of colon 07/06/2013  . Family hx of colon cancer 07/06/2013  . Acute tonsillitis 12/16/2012  . Effusion of knee joint, left 08/09/2010  . Knee pain 08/09/2010  . ACL laxity 08/09/2010   History  Substance Use Topics  . Smoking status: Former Smoker    Types: Cigarettes    Quit date: 06/27/2004  . Smokeless tobacco: Current User    Types: Snuff  . Alcohol Use: 3.6 oz/week    6 Cans of beer per week   family history includes Cancer in his father; Colon cancer in his father; Hypertension in his father. There is no history of Rectal cancer or Stomach cancer.  Objective:   Physical Exam   well-developed white male in no acute distress, pleasant accompanied by his wife blood pressure 122/84 pulse 68 height 5 foot 9 weight 216. HEENT; nontraumatic normocephalic EOMI PERRLA sclera anicteric, Neck; supple no JVD, Cardiovascular; regular rate and rhythm with S1-S2 no murmur or gallop, Ulnar clear bilaterally, Abdomen; soft he is tender in the left mid quadrant left lower quadrant there is no guarding or rebound no palpable mass or hepatosplenomegaly bowel  sounds are present, Rectal; exam not done, Extremity; no clubbing cyanosis or edema skin warm and dry, Psych; mood and affect normal and appropriate        Assessment & Plan:  #74 43 year old white male with 3-4 month history of mid abdominal pain aggravated by PO intake and change in bowel habits with alternating loose and normal stools and postprandial urgency. Abdominal tenderness is primarily left-sided Etiology is not clear this may be IBS related,  Rule out IBD rule out other intra-abdominal inflammatory process   #2 generalized fatigue-suspect this may be secondary to lifestyle with frequent cross country travel  #3 history of adenomatous colon polyps on colonoscopy January 2014-was advised to have 3 year followup colonoscopy  #4 family history of colon cancer in patient's father diagnosed in his 65s  Plan; CBC with differential, CMET, CRP, TSH Schedule for CT scan of the abdomen and pelvis with contrast If CT scan is unrevealing he will need followup colonoscopy with Dr. Hilarie Fredrickson.  Addendum: Reviewed and agree with initial management. Jerene Bears, MD

## 2013-07-06 NOTE — Patient Instructions (Signed)
Please go to the basement level to have your labs drawn.    You have been scheduled for a CT scan of the abdomen and pelvis at [facility] ([address]---this is in the same building as [facility]).   You are scheduled on Tuesday 07-07-2013 at 9:00 am . You should arrive at 8:45 am prior to your appointment time for registration. Please follow the written instructions below on the day of your exam:  WARNING: IF YOU ARE ALLERGIC TO IODINE/X-RAY DYE, PLEASE NOTIFY RADIOLOGY IMMEDIATELY AT [phone]! YOU WILL BE GIVEN A 13 HOUR PREMEDICATION PREP.  1) Do not eat or drink anything after 5:00 am  (4 hours prior to your test) 2) You have been given 2 bottles of oral contrast to drink. The solution may taste  better if refrigerated, but do NOT add ice or any other liquid to this solution. Shake  well before drinking.    Drink 1 bottle of contrast @ 7:00 am  (2 hours prior to your exam)  Drink 1 bottle of contrast @ 8:00 am  (1 hour prior to your exam)  You may take any medications as prescribed with a small amount of water except for the following: Metformin, Glucophage, Glucovance, Avandamet, Riomet, Fortamet, Actoplus Met, Janumet, Glumetza or Metaglip. The above medications must be held the day of the exam AND 48 hours after the exam.  The purpose of you drinking the oral contrast is to aid in the visualization of your intestinal tract. The contrast solution may cause some diarrhea. Before your exam is started, you will be given a small amount of fluid to drink. Depending on your individual set of symptoms, you may also receive an intravenous injection of x-ray contrast/dye. Plan on being at [facility] for 30 minutes or long, depending on the type of exam you are having performed.  If you have any questions regarding your exam or if you need to reschedule, you may call the CT department at [phone] between the hours of 8:00 am and 5:00 pm,  Monday-Friday.  ________________________________________________________________________

## 2013-07-07 ENCOUNTER — Telehealth: Payer: Self-pay | Admitting: *Deleted

## 2013-07-07 ENCOUNTER — Ambulatory Visit (INDEPENDENT_AMBULATORY_CARE_PROVIDER_SITE_OTHER)
Admission: RE | Admit: 2013-07-07 | Discharge: 2013-07-07 | Disposition: A | Discharge status: Home / Self Care | Payer: PRIVATE HEALTH INSURANCE | Source: Ambulatory Visit | Attending: Physician Assistant | Admitting: Physician Assistant

## 2013-07-07 DIAGNOSIS — R1032 Left lower quadrant pain: Secondary | ICD-10-CM

## 2013-07-07 DIAGNOSIS — R194 Change in bowel habit: Secondary | ICD-10-CM

## 2013-07-07 DIAGNOSIS — R198 Other specified symptoms and signs involving the digestive system and abdomen: Secondary | ICD-10-CM

## 2013-07-07 DIAGNOSIS — D126 Benign neoplasm of colon, unspecified: Secondary | ICD-10-CM

## 2013-07-07 DIAGNOSIS — Z800 Family history of malignant neoplasm of digestive organs: Secondary | ICD-10-CM

## 2013-07-07 MED ORDER — IOHEXOL 300 MG/ML  SOLN
100.0000 mL | Freq: Once | INTRAMUSCULAR | Status: AC | PRN
  Administered 2013-07-07: 100 mL via INTRAVENOUS

## 2013-07-07 NOTE — Telephone Encounter (Signed)
Patient wife calling for CT results and to schedule prop colon with Dr. Hilarie Fredrickson. She will have to call back to schedule after she gets patient's August schedule.

## 2013-07-08 ENCOUNTER — Encounter: Payer: Self-pay | Admitting: Internal Medicine

## 2013-08-07 ENCOUNTER — Ambulatory Visit (AMBULATORY_SURGERY_CENTER): Payer: Self-pay | Admitting: *Deleted

## 2013-08-07 VITALS — Ht 69.0 in | Wt 216.4 lb

## 2013-08-07 DIAGNOSIS — R1032 Left lower quadrant pain: Secondary | ICD-10-CM

## 2013-08-07 MED ORDER — MOVIPREP 100 G PO SOLR: ORAL | Status: DC

## 2013-08-07 NOTE — Progress Notes (Signed)
No allergies to eggs or soy. No problems with anesthesia.  Pt given Emmi instructions for colonoscopy  No oxygen use  No diet drug use  

## 2013-08-31 ENCOUNTER — Ambulatory Visit (AMBULATORY_SURGERY_CENTER): Payer: PRIVATE HEALTH INSURANCE | Admitting: Internal Medicine

## 2013-08-31 ENCOUNTER — Encounter: Payer: Self-pay | Admitting: Internal Medicine

## 2013-08-31 ENCOUNTER — Other Ambulatory Visit: Payer: Self-pay | Admitting: Internal Medicine

## 2013-08-31 VITALS — BP 126/73 | HR 76 | Temp 97.7°F | Resp 18 | Ht 69.0 in | Wt 216.0 lb

## 2013-08-31 DIAGNOSIS — D126 Benign neoplasm of colon, unspecified: Secondary | ICD-10-CM

## 2013-08-31 DIAGNOSIS — E782 Mixed hyperlipidemia: Secondary | ICD-10-CM

## 2013-08-31 DIAGNOSIS — R197 Diarrhea, unspecified: Secondary | ICD-10-CM

## 2013-08-31 DIAGNOSIS — R1032 Left lower quadrant pain: Secondary | ICD-10-CM

## 2013-08-31 DIAGNOSIS — Z86010 Personal history of colonic polyps: Secondary | ICD-10-CM

## 2013-08-31 DIAGNOSIS — Z800 Family history of malignant neoplasm of digestive organs: Secondary | ICD-10-CM

## 2013-08-31 DIAGNOSIS — Z860101 Personal history of adenomatous and serrated colon polyps: Secondary | ICD-10-CM

## 2013-08-31 MED ORDER — SODIUM CHLORIDE 0.9 % IV SOLN: 500.0000 mL | INTRAVENOUS | Status: DC

## 2013-08-31 MED ORDER — RIFAXIMIN 550 MG PO TABS: 550.0000 mg | ORAL_TABLET | Freq: Three times a day (TID) | ORAL | Status: DC

## 2013-08-31 NOTE — Progress Notes (Signed)
Called to room to assist during endoscopic procedure.  Patient ID and intended procedure confirmed with present staff. Received instructions for my participation in the procedure from the performing physician.  

## 2013-08-31 NOTE — Progress Notes (Signed)
Report to PACU, RN, vss, BBS= Clear.  

## 2013-08-31 NOTE — Op Note (Signed)
[facility]  [address] [place], [id number]   COLONOSCOPY PROCEDURE REPORT  PATIENT: John, Knox  MR#: [id number] BIRTHDATE: 1970-07-15 , 42  yrs. old GENDER: Male ENDOSCOPIST: Jerene Bears, MD PROCEDURE DATE:  08/31/2013 PROCEDURE:   Colonoscopy with biopsy and Colonoscopy with snare polypectomy First Screening Colonoscopy - Avg.  risk and is 50 yrs.  old or older - No.  Prior Negative Screening - Now for repeat screening. N/A  History of Adenoma - Now for follow-up colonoscopy & has been > or = to 3 yrs.  No.  It has been less than 3 yrs since last colonoscopy.  Medical reason.  Polyps Removed Today? Yes. ASA CLASS:   Class II INDICATIONS:Change in bowel habits, urgent loose stools/diarrhea, left lower quadrant abdominal pain, history of adenomatous colon polyps, family history of colon cancer, last colonoscopy January 2014 Sharlett Iles). MEDICATIONS: MAC sedation, administered by CRNA and propofol (Diprivan) 400mg  IV  DESCRIPTION OF PROCEDURE:   After the risks benefits and alternatives of the procedure were thoroughly explained, informed consent was obtained.  A digital rectal exam revealed no rectal mass.   The LB [id number]  endoscope was introduced through the anus and advanced to the terminal ileum which was intubated for a short distance. No adverse events experienced.   The quality of the prep was good, using MoviPrep  The instrument was then slowly withdrawn as the colon was fully examined.   COLON FINDINGS: The mucosa appeared normal in the terminal ileum. A sessile polyp measuring 5 mm in size was found in the sigmoid colon.  A polypectomy was performed with a cold snare.  The resection was complete and the polyp tissue was completely retrieved.   Mild rectal erythema in the distal rectum from the dentate line to about 4-5 cm, query proctitis.  Multiple biopsies of the area were performed using cold forceps.   The colonic  mucosa appeared normal at the cecum, in the ascending colon, transverse colon, descending colon, and sigmoid colon.  Multiple random biopsies of the area were performed.  Retroflexed views revealed no abnormalities. The time to cecum=1 minutes 58 seconds.  Withdrawal time=10 minutes 17 seconds.  The scope was withdrawn and the procedure completed.  COMPLICATIONS: There were no complications.  ENDOSCOPIC IMPRESSION: 1.   Normal mucosa in the terminal ileum 2.   Sessile polyp measuring 5 mm in size was found in the sigmoid colon; polypectomy was performed with a cold snare 3.   Erythema in the distal rectum; multiple biopsies of the area were performed using cold forceps to rule out proctitis 4.   The colonic mucosa appeared normal at the cecum, in the ascending colon, transverse colon, descending colon, and sigmoid colon; multiple random biopsies of the area were performed  RECOMMENDATIONS: 1.  Await biopsy results 2.  Trial of rifaximin 550 mg three times daily for 14 days 3.  Office follow-up next available 4.  Repeat colonoscopy based on pathology results, but no longer than 5 years given personal history of colon polyps and family history of colon cancer   eSigned:  Jerene Bears, MD 08/31/2013 2:01 PM   cc: The Patient and Chevis Pretty, NP   PATIENT NAME:  John, Knox MR#: [id number]

## 2013-08-31 NOTE — Patient Instructions (Signed)
CENTER: Refer to the procedure report that was given to you for any specific questions about what was found during the examination.  If the procedure report does not answer your questions, please call your gastroenterologist to clarify.  If you requested that your care partner not be given the details of your procedure findings, then the procedure report has been included in a sealed envelope for you to review at your convenience later.  YOU SHOULD EXPECT: Some feelings of bloating in the abdomen. Passage of more gas than usual.  Walking can help get rid of the air that was put into your GI tract during the procedure and reduce the bloating. If you had a lower endoscopy (such as a colonoscopy or flexible sigmoidoscopy) you may notice spotting of blood in your stool or on the toilet paper. If you underwent a bowel prep for your procedure, then you may not have a normal bowel movement for a few days.  DIET: Your first meal following the procedure should be a light meal and then it is ok to progress to your normal diet.  A half-sandwich or bowl of soup is an example of a good first meal.  Heavy or fried foods are harder to digest and may make you feel nauseous or bloated.  Likewise meals heavy in dairy and vegetables can cause extra gas to form and this can also increase the bloating.  Drink plenty of fluids but you should avoid alcoholic beverages for 24 hours.  ACTIVITY: Your care partner should take you home directly after the procedure.  You should plan to take it easy, moving slowly for the rest of the day.  You can resume normal activity the day after the procedure however you should NOT DRIVE or use heavy machinery for 24 hours (because of the sedation medicines used during the test).    SYMPTOMS TO REPORT IMMEDIATELY: A gastroenterologist can be reached at any hour.  During normal business hours, 8:30 AM to 5:00 PM Monday through Friday, call [phone].  After hours and on weekends, please call  the GI answering service at [phone] who will take a message and have the physician on call contact you.   Following lower endoscopy (colonoscopy or flexible sigmoidoscopy):  Excessive amounts of blood in the stool  Significant tenderness or worsening of abdominal pains  Swelling of the abdomen that is new, acute  Fever of 100F or higher    FOLLOW UP: If any biopsies were taken you will be contacted by phone or by letter within the next 1-3 weeks.  Call your gastroenterologist if you have not heard about the biopsies in 3 weeks.  Our staff will call the home number listed on your records the next business day following your procedure to check on you and address any questions or concerns that you may have at that time regarding the information given to you following your procedure. This is a courtesy call and so if there is no answer at the home number and we have not heard from you through the emergency physician on call, we will assume that you have returned to your regular daily activities without incident.  SIGNATURES/CONFIDENTIALITY: You and/or your care partner have signed paperwork which will be entered into your electronic medical record.  These signatures attest to the fact that that the information above on your After Visit Summary has been reviewed and is understood.  Full responsibility of the confidentiality of this discharge information lies with you and/or your care-partner.  Polyp information given.  Random biopsies for microscopic colitis.  Rifaximin 550 mg. 3 times a day for 14 days.  Dr. Vena Rua office will get in touch with you for next available appointment.

## 2013-09-01 ENCOUNTER — Telehealth: Payer: Self-pay | Admitting: *Deleted

## 2013-09-01 NOTE — Telephone Encounter (Signed)
  Follow up Call-  Call back number 08/31/2013 01/23/2012  Post procedure Call Back phone  # [phone]  Permission to leave phone message Yes Yes     Patient questions:  Do you have a fever, pain , or abdominal swelling? No. Pain Score  0 *  Have you tolerated food without any problems? Yes.    Have you been able to return to your normal activities? Yes.    Do you have any questions about your discharge instructions: Diet   No. Medications  No. Follow up visit  No.  Do you have questions or concerns about your Care? No.  Actions: * If pain score is 4 or above: No action needed, pain <4.

## 2013-09-08 ENCOUNTER — Encounter: Payer: Self-pay | Admitting: Internal Medicine

## 2013-10-27 ENCOUNTER — Ambulatory Visit: Payer: PRIVATE HEALTH INSURANCE | Admitting: Internal Medicine

## 2014-01-05 ENCOUNTER — Other Ambulatory Visit: Payer: Self-pay

## 2014-01-05 ENCOUNTER — Telehealth: Payer: Self-pay

## 2014-01-05 DIAGNOSIS — R918 Other nonspecific abnormal finding of lung field: Secondary | ICD-10-CM

## 2014-01-05 NOTE — Telephone Encounter (Signed)
-----   Message from Hulan Saas, RN sent at 01/04/2014  1:33 PM EST -----   ----- Message -----    From: Hulan Saas, RN    Sent: 01/04/2014      To: Hulan Saas, RN  Patient needs CT chest  6 month f/u pulmonary nodules per AE

## 2014-01-05 NOTE — Telephone Encounter (Signed)
Spoke with the patient earlier. He does not have a PCP at this time. He is due the follow up CT of the chest to re-evaluate the pulmonary nodules per Edward Jolly, PA. Per his request, left appointment information on his voicemail. CT of chest with contrast at [facility] on 01/12/14 at 4:30. NPO 4 hours prior.

## 2014-01-06 ENCOUNTER — Other Ambulatory Visit (HOSPITAL_COMMUNITY): Payer: PRIVATE HEALTH INSURANCE

## 2014-01-06 ENCOUNTER — Ambulatory Visit (HOSPITAL_COMMUNITY): Admission: RE | Admit: 2014-01-06 | Payer: PRIVATE HEALTH INSURANCE | Source: Ambulatory Visit

## 2014-01-07 ENCOUNTER — Ambulatory Visit (HOSPITAL_COMMUNITY): Admission: RE | Admit: 2014-01-07 | Payer: PRIVATE HEALTH INSURANCE | Source: Ambulatory Visit

## 2014-01-07 ENCOUNTER — Ambulatory Visit (HOSPITAL_COMMUNITY)
Admission: RE | Admit: 2014-01-07 | Discharge: 2014-01-07 | Disposition: A | Discharge status: Home / Self Care | Payer: 59 | Source: Ambulatory Visit | Attending: Gastroenterology | Admitting: Gastroenterology

## 2014-01-07 ENCOUNTER — Encounter (HOSPITAL_COMMUNITY): Payer: Self-pay

## 2014-01-07 DIAGNOSIS — R918 Other nonspecific abnormal finding of lung field: Secondary | ICD-10-CM

## 2014-01-07 MED ORDER — IOHEXOL 300 MG/ML  SOLN
80.0000 mL | Freq: Once | INTRAMUSCULAR | Status: AC | PRN
  Administered 2014-01-07: 80 mL via INTRAVENOUS

## 2014-01-12 ENCOUNTER — Ambulatory Visit (HOSPITAL_COMMUNITY): Payer: PRIVATE HEALTH INSURANCE

## 2015-01-19 ENCOUNTER — Telehealth: Payer: Self-pay

## 2015-01-19 ENCOUNTER — Other Ambulatory Visit: Payer: Self-pay

## 2015-01-19 DIAGNOSIS — R918 Other nonspecific abnormal finding of lung field: Secondary | ICD-10-CM

## 2015-01-19 NOTE — Telephone Encounter (Signed)
Pt scheduled for CT of Chest at [facility] 01/26/15@8 :30am, pt to arrive at 8:15am. Pt to be NPO after midnight. Left message for pt to call back.  Pt states he will be out of town for this date. Pt given the phone number [phone] to reschedule CT scan.

## 2015-01-19 NOTE — Telephone Encounter (Signed)
-----   Message from Maury Dus, RN sent at 01/12/2014  8:25 AM EST ----- Regarding: CT CHEST Pt needs CT of chest follow-up pulmonary nodules

## 2015-01-26 ENCOUNTER — Other Ambulatory Visit: Payer: PRIVATE HEALTH INSURANCE

## 2015-02-10 ENCOUNTER — Telehealth: Payer: Self-pay | Admitting: Internal Medicine

## 2015-02-10 NOTE — Telephone Encounter (Signed)
Pt given the phone number for  CT to reschedule his CT of chest.

## 2015-02-11 ENCOUNTER — Other Ambulatory Visit: Payer: PRIVATE HEALTH INSURANCE

## 2015-03-25 ENCOUNTER — Encounter: Payer: Self-pay | Admitting: *Deleted

## 2015-04-04 ENCOUNTER — Ambulatory Visit (INDEPENDENT_AMBULATORY_CARE_PROVIDER_SITE_OTHER): Payer: BLUE CROSS/BLUE SHIELD | Admitting: Internal Medicine

## 2015-04-04 ENCOUNTER — Encounter: Payer: Self-pay | Admitting: Internal Medicine

## 2015-04-04 ENCOUNTER — Ambulatory Visit (INDEPENDENT_AMBULATORY_CARE_PROVIDER_SITE_OTHER)
Admission: RE | Admit: 2015-04-04 | Discharge: 2015-04-04 | Disposition: A | Discharge status: Home / Self Care | Payer: BLUE CROSS/BLUE SHIELD | Source: Ambulatory Visit | Attending: Internal Medicine | Admitting: Internal Medicine

## 2015-04-04 VITALS — BP 112/80 | HR 76 | Ht 69.0 in | Wt 219.2 lb

## 2015-04-04 DIAGNOSIS — I2510 Atherosclerotic heart disease of native coronary artery without angina pectoris: Secondary | ICD-10-CM

## 2015-04-04 DIAGNOSIS — R918 Other nonspecific abnormal finding of lung field: Secondary | ICD-10-CM

## 2015-04-04 DIAGNOSIS — K580 Irritable bowel syndrome with diarrhea: Secondary | ICD-10-CM | POA: Diagnosis not present

## 2015-04-04 DIAGNOSIS — Z800 Family history of malignant neoplasm of digestive organs: Secondary | ICD-10-CM

## 2015-04-04 DIAGNOSIS — Z86010 Personal history of colonic polyps: Secondary | ICD-10-CM | POA: Diagnosis not present

## 2015-04-04 MED ORDER — RIFAXIMIN 550 MG PO TABS: 550.0000 mg | ORAL_TABLET | Freq: Three times a day (TID) | ORAL | Status: DC

## 2015-04-04 MED ORDER — IOPAMIDOL (ISOVUE-300) INJECTION 61%
80.0000 mL | Freq: Once | INTRAVENOUS | Status: AC | PRN
  Administered 2015-04-04: 80 mL via INTRAVENOUS

## 2015-04-04 NOTE — Patient Instructions (Addendum)
Your physician has requested that you go to the basement for the following lab work before leaving today: IgA, TtG  Please continue dicyclomine as needed.  We have sent your demographic information and a prescription for Xifaxan to [facility]. This pharmacy is able to get medication approved through insurance and get you the lowest copay possible. If you have not heard from them within 1 week, please call our office at [phone] to let us know.  Do not take Align while on Xifaxan  Please follow up with Dr Hilarie Fredrickson in 3 months.  Call our office in 1 month with an update on how you are feeling.

## 2015-04-04 NOTE — Progress Notes (Signed)
Subjective:    Patient ID: John Knox, male    DOB: 1970/12/12, 45 y.o.   MRN: [id number]  HPI John Knox is a 45 year old male with a past medical history of adenomatous colon polyp, colonic diverticulosis, chronic left lower abdominal pain with loose stools felt likely to be IBS who is seen for follow-up. He was last seen in the office by Nicoletta Ba, PA-C in June 2015 after which time colonoscopy was performed to evaluate change in bowel habits with urgency and loose stools along with left lower quadrant abdominal pain. He also has a family history of colon cancer. This test revealed normal terminal ileum. A 5 mm sigmoid polyp was removed found to be benign colonic mucosa. 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 Random biopsies were negative for IBD, microscopic colitis.  He reports he has continued to have issues with fecal urgency after eating. This can make work hard. He always "know where the bathroom is". At times she will avoid eating so as not to have loose stools. Loose stools can be 3-5 times per day. He is using Imodium 2 mg and 20 mg dicyclomine before breakfast and lunch. This seems to help. Diet definitely affects his bowel movements. He continues to have intermittent left lower quadrant pain which seems to come and go. Seems better with defecation. No upper GI complaint including no nausea, vomiting, severe heartburn, dysphagia or odynophagia. No weight loss. Appetite has remained good. He has been taking align once daily as a probiotic and feels like this helps.  Review of Systems As per history of present illness, otherwise negative  Current Medications, Allergies, Past Medical History, Past Surgical History, Family History and Social History were reviewed in [employer] record.     Objective:   Physical Exam BP 112/80 mmHg  Pulse 76  Ht 5\' 9"  (1.753 m)  Wt 219 lb 4 oz (99.451 kg)  BMI 32.36 kg/m2 Constitutional: Well-developed and  well-nourished. No distress. HEENT: Normocephalic and atraumatic. Oropharynx is clear and moist. No oropharyngeal exudate. Conjunctivae are normal.  No scleral icterus. Neck: Neck supple. Trachea midline. Cardiovascular: Normal rate, regular rhythm and intact distal pulses. No M/R/G Pulmonary/chest: Effort normal and breath sounds normal. No wheezing, rales or rhonchi. Abdominal: Soft, Left lower quadrant tenderness without rebound or guarding, nondistended. Bowel sounds active throughout. There are no masses palpable. No hepatosplenomegaly. Extremities: no clubbing, cyanosis, or edema Lymphadenopathy: No cervical adenopathy noted. Neurological: Alert and oriented to person place and time. Skin: Skin is warm and dry. No rashes noted. Psychiatric: Normal mood and affect. Behavior is normal.  Abdomen and pelvis CT 2015 -- mild fatty liver, no acute process in the abdomen or pelvis. Pulmonary nodules      Assessment & Plan:  45 year old male with a past medical history of adenomatous colon polyp, colonic diverticulosis, chronic left lower abdominal pain with loose stools felt likely to be IBS who is seen for follow-up.  1. 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 Biopsies negative for IBD and microscopic colitis. I recommended rifaximin 550 3 times a day 14 days to recheck celiac panel for completeness. He can continue Bentyl and Imodium as needed but hopefully these will not be necessary after treatment with rifaximin. For fax me is not beneficial Viberzi would be a good option. He is asked to call me in one month  to notify me on response to rifaximin. Hold align during antibody therapy can resume probiotic if needed afterward. Three-month follow-up.  2. Pulmonary nodules -- stable and felt benign. No follow up necessary per radiology  3. Atherosclerosis --  advanced for age. I recommend cardiology referral  4. Family history of colon cancer/history of adenomatous polyp -- surveillance colonoscopy recommended August 2020  25 minutes spent with the patient today. Greater than 50% was spent in counseling and coordination of care with the patient

## 2015-04-05 ENCOUNTER — Telehealth: Payer: Self-pay | Admitting: Internal Medicine

## 2015-04-05 NOTE — Telephone Encounter (Signed)
I have spoken to patient regarding coronary artery atherosclerosis seen on recent CT scan and have advised that given his age, Dr Hilarie Fredrickson would like him to see cardiology. Cardiology appointment is with Dr Acie Fredrickson on 04/25/15 @ 8:45 am. Patient verbalizes understanding. I have also advised that fatty liver remains stable and pulmonary nodules seen are likely benign and do not require follow up. He verbalizes understanding of this as well.

## 2015-04-05 NOTE — Telephone Encounter (Signed)
Patient wife calling in regarding this. She has additional questions. Best # [phone]

## 2015-04-05 NOTE — Addendum Note (Signed)
Addended by: Larina Bras on: 04/05/2015 02:47 PM   Modules accepted: Orders

## 2015-04-05 NOTE — Telephone Encounter (Signed)
I have spoken with patient's wife (ok per HIPAA form) to confirm what I spoke with patient about. She verbalizes understanding.

## 2015-04-06 ENCOUNTER — Telehealth: Payer: Self-pay | Admitting: Internal Medicine

## 2015-04-06 NOTE — Telephone Encounter (Signed)
Noted  

## 2015-04-07 ENCOUNTER — Telehealth: Payer: Self-pay | Admitting: *Deleted

## 2015-04-07 ENCOUNTER — Telehealth: Payer: Self-pay | Admitting: Internal Medicine

## 2015-04-07 NOTE — Telephone Encounter (Signed)
Patient's insurance, "[employer]" has denied Xifaxan for the treatment of IBS-D. It requires a previous trial of a TCA medication such as amitriptyline or imipramine. Dr Hilarie Fredrickson, please advise.Marland KitchenMarland Kitchen

## 2015-04-07 NOTE — Telephone Encounter (Signed)
I have spoken to patient. He wanted to find out if cardiology had any sooner dates available for appointment. I gave him the number to cardiology to contact them.

## 2015-04-08 MED ORDER — AMITRIPTYLINE HCL 50 MG PO TABS: 50.0000 mg | ORAL_TABLET | Freq: Every day | ORAL | Status: DC

## 2015-04-08 NOTE — Telephone Encounter (Signed)
Patient advised of information below and verbalizes understanding. He will pick up amitriptyline rx.

## 2015-04-08 NOTE — Telephone Encounter (Signed)
His insurance is dictating a repeat prior to approval of rifaximin for IBS-D Okay for a trial of amitriptyline 50 mg daily at bedtime. Have him notify me within 2-4 weeks if this does not improve left lower quadrant abdominal pain and or loose stools

## 2015-04-14 ENCOUNTER — Observation Stay
Admission: EM | Admit: 2015-04-14 | Disposition: A | Discharge status: Home / Self Care | Payer: Self-pay | Source: Ambulatory Visit | Attending: Emergency Medicine | Admitting: Emergency Medicine

## 2015-04-14 ENCOUNTER — Ambulatory Visit
Admission: AD | Admit: 2015-04-14 | Discharge: 2015-04-14 | Disposition: A | Discharge status: Other Acute Inpatient Hospital | Payer: Self-pay | Attending: Family Medicine | Admitting: Family Medicine

## 2015-04-14 ENCOUNTER — Other Ambulatory Visit: Payer: Self-pay | Admitting: Cardiology

## 2015-04-14 ENCOUNTER — Other Ambulatory Visit: Payer: Self-pay | Admitting: Gastroenterology

## 2015-04-14 ENCOUNTER — Encounter: Payer: Self-pay | Admitting: Emergency Medicine

## 2015-04-14 ENCOUNTER — Telehealth: Payer: Self-pay | Admitting: Internal Medicine

## 2015-04-14 DIAGNOSIS — R079 Chest pain, unspecified: Secondary | ICD-10-CM

## 2015-04-14 DIAGNOSIS — R200 Anesthesia of skin: Secondary | ICD-10-CM

## 2015-04-14 HISTORY — DX: Other specified symptoms and signs involving the circulatory and respiratory systems: R09.89

## 2015-04-14 HISTORY — DX: Pure hypercholesterolemia, unspecified: E78.00

## 2015-04-14 HISTORY — DX: Atherosclerotic heart disease of native coronary artery without angina pectoris: I25.10

## 2015-04-14 HISTORY — DX: Other nonspecific abnormal finding of lung field: R91.8

## 2015-04-14 HISTORY — DX: Diarrhea, unspecified: R19.7

## 2015-04-14 LAB — CBC AND DIFFERENTIAL
Baso # K/uL: 0 10*3/uL (ref 0.0–0.1)
Basophil %: 0.2 %
Eos # K/uL: 0 10*3/uL (ref 0.0–0.5)
Eosinophil %: 0.4 %
Hematocrit: 42 % (ref 40–51)
Hemoglobin: 15.1 g/dL (ref 13.7–17.5)
IMM Granulocytes #: 0.1 10*3/uL (ref 0.0–0.1)
IMM Granulocytes: 0.5 %
Lymph # K/uL: 1.6 10*3/uL (ref 1.3–3.6)
Lymphocyte %: 17.4 %
MCH: 31 pg/cell (ref 26–32)
MCHC: 36 g/dL (ref 32–37)
MCV: 88 fL (ref 79–92)
Mono # K/uL: 0.6 10*3/uL (ref 0.3–0.8)
Monocyte %: 6.2 %
Neut # K/uL: 6.9 10*3/uL — ABNORMAL HIGH (ref 1.8–5.4)
Nucl RBC # K/uL: 0 10*3/uL (ref 0.0–0.0)
Nucl RBC %: 0 /100 WBC (ref 0.0–0.2)
Platelets: 329 10*3/uL (ref 150–330)
RBC: 4.8 MIL/uL (ref 4.6–6.1)
RDW: 12.5 % (ref 11.6–14.4)
Seg Neut %: 75.3 %
WBC: 9.2 10*3/uL — ABNORMAL HIGH (ref 4.2–9.1)

## 2015-04-14 LAB — CK ISOENZYMES
CK: 180 U/L — ABNORMAL HIGH (ref 46–171)
Mass CKMB: 1.3 ng/mL (ref 0.0–10.4)
Relative Index: 0.7 % (ref 0.0–5.0)

## 2015-04-14 LAB — BASIC METABOLIC PANEL
Anion Gap: 18 — ABNORMAL HIGH (ref 7–16)
CO2: 22 mmol/L (ref 20–28)
Calcium: 9.8 mg/dL (ref 9.0–10.3)
Chloride: 100 mmol/L (ref 96–108)
Creatinine: 0.83 mg/dL (ref 0.67–1.17)
GFR,Black: 124 *
GFR,Caucasian: 107 *
Glucose: 102 mg/dL — ABNORMAL HIGH (ref 60–99)
Lab: 13 mg/dL (ref 6–20)
Potassium: 3.9 mmol/L (ref 3.3–5.1)
Sodium: 140 mmol/L (ref 133–145)

## 2015-04-14 LAB — TROPONIN T
Troponin T: <0.01 ng/mL (ref 0.00–0.02)
Troponin T: <0.01 ng/mL (ref 0.00–0.02)

## 2015-04-14 LAB — HOLD BLUE

## 2015-04-14 MED ORDER — OXYCODONE HCL 5 MG PO TABS *I*
5.0000 mg | ORAL_TABLET | Freq: Four times a day (QID) | ORAL | Status: DC | PRN
Start: 2015-04-14 — End: 2015-04-15

## 2015-04-14 MED ORDER — NITROGLYCERIN 0.4 MG SL SUBL *I*
0.4000 mg | SUBLINGUAL_TABLET | SUBLINGUAL | Status: DC | PRN
Start: 2015-04-14 — End: 2015-04-15

## 2015-04-14 MED ORDER — DICYCLOMINE HCL 20 MG PO TABS *I*
20.0000 mg | ORAL_TABLET | Freq: Four times a day (QID) | ORAL | Status: DC | PRN
Start: 2015-04-14 — End: 2015-04-15
  Filled 2015-04-14 (×2): qty 1

## 2015-04-14 MED ORDER — SIMVASTATIN 10 MG PO TABS *I*
20.0000 mg | ORAL_TABLET | Freq: Every evening | ORAL | Status: DC
Start: 2015-04-14 — End: 2015-04-15
  Administered 2015-04-14: 20 mg via ORAL
  Filled 2015-04-14: qty 2

## 2015-04-14 MED ORDER — ASPIRIN 81 MG PO CHEW *I*
324.0000 mg | CHEWABLE_TABLET | Freq: Once | ORAL | Status: AC
Start: 2015-04-14 — End: 2015-04-14
  Administered 2015-04-14: 324 mg via ORAL

## 2015-04-14 MED ORDER — ACETAMINOPHEN 325 MG PO TABS *I*
650.0000 mg | ORAL_TABLET | Freq: Four times a day (QID) | ORAL | Status: DC | PRN
Start: 2015-04-14 — End: 2015-04-15
  Administered 2015-04-14 – 2015-04-15 (×2): 650 mg via ORAL
  Filled 2015-04-14 (×2): qty 2

## 2015-04-14 MED ORDER — ONDANSETRON HCL 2 MG/ML IV SOLN *I*
4.0000 mg | Freq: Four times a day (QID) | INTRAMUSCULAR | Status: DC | PRN
Start: 2015-04-14 — End: 2015-04-15

## 2015-04-14 MED ORDER — ALPRAZOLAM 0.25 MG PO TABS *I*
0.2500 mg | ORAL_TABLET | Freq: Three times a day (TID) | ORAL | Status: DC | PRN
Start: 2015-04-14 — End: 2015-04-15

## 2015-04-14 NOTE — ED Notes (Signed)
Bed: PA-09  Expected date: 04/14/15  Expected time: 12:35 PM  Means of arrival: [place]  Comments:  ADULT CALL-IN    Patient Name:Eltringham, Oswell    MRN: [id number]    AGE:45    DOB: 03-02-2070    PCP/Service Referral: DR. Delbert HarnessAPUANO, [facility]    Patient Information Note: PT WITH C/O CRUSHING MIDSTERNAL CHEST PAIN AND FACIAL NUMBNESS THAT STARTED 30 MINS PRIOR TO ARRIVAL AT URGENT CARE.  HR 130 BP 143/93 O2 INITIALLY 93% NOW 100% ON 2 L NC.  GIVEN 324 MG ASA, EKG OK.    Tests/Orders Request  ed:    Vital Signs:    Relevant Medications:    Requested Evaluation AV:[id number]:ADULT ED    MD Requesting Call Back:NO    IF CALL BACK REQUESTED:    Notify:   At:    Is caller requesting admission for this patient?: NO    If yes, to which service?    Is referring physician an [facility] admitting provider?NO        Call reported XB:JYNWto:CALL IN NOTE    Author Ivory BroadLoretta M Brandelyn Henne, RN as of 04/14/2015 at 12:35 PM

## 2015-04-14 NOTE — ED Triage Notes (Signed)
Patient coming from [facility] after presenting with chest pain.  Patient had an acute onset of chest pain while driving, stopped at the store and went to the bathroom, chest pain than became a crushing pain.  [place] here on Monday.  -SOB.         Triage Note   Hollace HaywardAlison J Helzer, RN

## 2015-04-14 NOTE — UC Provider Note (Signed)
History     Chief Complaint   Patient presents with    Chest Pain     Crushing CP 30 min. ago. Has appt. with cardiology tomorrow. Takes meds for high cholesterol.      Patient is a 45 y.o. male presenting with chest pain.   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         No past surgical history on file.    No family history on file.      Social History    has no tobacco, alcohol, drug, and sexual activity history on file.    Living Situation     Questions Responses    Patient lives with     Homeless     Caregiver for other family member     External Services     Employment     Domestic Violence Risk           Review of Systems   Review of Systems   Constitutional: Positive for activity change. Negative for appetite change, chills, diaphoresis, fatigue and fever.   HENT: Negative for facial swelling.    Eyes: Negative for pain.   Respiratory: Positive for chest tightness and shortness of breath.    Cardiovascular: Positive for chest pain and palpitations. Negative for leg swelling.   Endocrine: Negative for heat intolerance.   Skin: Positive for color change. Negative for pallor, rash and wound.   Allergic/Immunologic: Negative for immunocompromised state.   Neurological: Positive for numbness (Facial). Negative for dizziness.   Psychiatric/Behavioral: Negative for agitation, behavioral problems and confusion.        Physical Exam     ED Triage Vitals   BP Heart Rate Heart Rate (via Pulse Ox) Resp Temp Temp src SpO2 O2 Device O2 Flow Rate   04/14/15 1230 04/14/15 1230 04/14/15 1230 04/14/15 1230 04/14/15 1230 04/14/15 1230 04/14/15 1230 04/14/15 1230 --   143/93 130 130 20 37.1 C (98.8 F) TEMPORAL 93 % None (Room air)       Weight           04/14/15 1230           99.8 kg (220 lb)                    Physical Exam   Constitutional: He is oriented to person, place, and time. He appears well-developed and well-nourished.   HENT:   Head: Normocephalic and atraumatic.   Eyes: Conjunctivae are normal.   Cardiovascular: Regular rhythm and normal heart sounds.  Exam reveals no gallop and no friction rub.    No murmur heard.  Pulmonary/Chest: Effort normal and breath sounds normal.   Abdominal: Soft. Bowel sounds are normal. He exhibits no distension. There is no tenderness.  Neurological: He is alert and oriented to person, place, and time.   Psychiatric: He has a normal mood and affect. His behavior is normal. Judgment and thought content normal.   Vitals reviewed.       Medical Decision Making        Initial Evaluation:  ED First Provider Contact     Date/Time Event User Comments    04/14/15 1231 ED Provider [facility] Initial Face to Face Provider Contact          Patient seen by me on arrival date of 04/14/2015 at at time of arrival  12:27 PM.  Initial face to face evaluation time noted above may be discrepant due to patient acuity and delay in documentation.    Assessment:  44 y.o.male comes to the Urgent Care Center with chest pain and facial numbness    Differential Diagnosis includes   Acute MI   Angina  CVA  PE  Costochondritis  GERD    Plan:   Orders Placed This Encounter    aspirin chewable tablet 324 mg       Final Diagnosis    ICD-10-CM ICD-9-CM   1. Chest pain, unspecified type R07.9 786.50   2. Facial numbness R20.0 782.0       Encourage fluids, encourage rest, good hand hygiene.    Use over  the counter medications as discussed.    Please follow up with your physician as below:    Follow-up Information     [facility]    Specialty:  Emergency Medicine    Contact information:    [address]  [place] [id number]  [phone]      [facility].    Gladstone Pihracy Edmunds RN  discussed status with wife Rubye OaksCameron Clardy [phone]    Thank you Keene BreathMatthew Mcmillion for coming to [facility] for your health care concerns.    If your condition changes and/or worsens please follow up with her primary doctor and/or return to the urgent care center.    If short of breath, chest pains or any other concerns please report to the emergency room.    In the event of an Emergency dial 911.      Final Diagnosis  Final diagnoses:   [R07.9] Chest pain, unspecified type (Primary)   [R20.0] Facial numbness           Lissa MoralesMATTHEW Maythe Deramo, MD       Lissa Moralesapuano, Daemien, MD  04/14/15 1253

## 2015-04-14 NOTE — ED Notes (Signed)
Plan of Care     Telemetry monitoring  Trop EKG #3: 0210  Regular diet  Comfort measures  VS q4h

## 2015-04-14 NOTE — First Provider Contact (Signed)
ED Medical Screening Exam Note    Initial provider evaluation performed by   ED First Provider Contact     Date/Time Event User Comments    04/14/15 1351 ED Provider First Contact Rawson Minix, [facility] Initial [facility] to Face Provider Contact        Cp, tachycardia at Urgent Care  Vital signs reviewed.    Orders placed:  EKG, LABS and telemetry     Patient requires further evaluation.     Mikaiya Tramble ANN PrestonWEBER, NP, 04/14/2015, 1:51 PM    Supervising physician Dr Lynnae PrudeKonwinski  was immediately available     Valarie ConesWeber, Chales AbrahamsMary Ann, NP  04/14/15 1352

## 2015-04-14 NOTE — ED Provider Notes (Signed)
History     Chief Complaint   Patient presents with    Chest Pain     Patient is a 45 y.o. male presenting with chest pain.   History provided by:  Patient  Chest Pain   Chest pain location: retrosternal.  Pain quality: tightness    Pain radiates to:  L arm and R arm  Pain severity:  Moderate  Onset quality:  Gradual  Timing:  Sporadic  Progression:  Resolved  Chronicity:  New  Ineffective treatments:  None tried  Associated symptoms: diaphoresis, heartburn and shortness of breath    Associated symptoms: no abdominal pain, no altered mental status, no anorexia, no back pain, no cough, no dizziness, no fever, no headache, no lower extremity edema, no nausea, no near-syncope, no numbness, no palpitations, no syncope, no vomiting and no weakness    Associated symptoms comment:  Tingling feeling without any numbness or weakness to both arms (radiates from chest), both sides of face    Past Medical History:   Diagnosis Date    Cardiac complaint     has cardiology appointment in [facility] [place] 04/15/15 for "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 His smokeless tobacco use includes Snuff. He reports that he drinks about 2.4 oz of alcohol per week  He reports that he does not use illicit drugs. His sexual activity history is not on file.    Living Situation     Questions Responses    Patient lives with Family    Homeless No    Caregiver for other family member No    External Services None    Employment Employed    Domestic Violence Risk No          Problem List   There is no problem list on file for this patient.      Review of  Systems   Review of Systems   Constitutional: Positive for diaphoresis. Negative for chills and fever.   HENT: Negative for congestion.    Eyes: Negative for visual disturbance.   Respiratory: Positive for chest tightness and shortness of breath. Negative for cough.    Cardiovascular: Positive for chest pain. Negative for palpitations, syncope and near-syncope.   Gastrointestinal: Positive for heartburn. Negative for abdominal pain, anorexia, nausea and vomiting.   Musculoskeletal: Negative for back pain and neck pain.   Skin: Positive for pallor.        Was clammy with episode   Neurological: Negative for dizziness, weakness, numbness and headaches.   Psychiatric/Behavioral: Negative for confusion.       Physical Exam     ED Triage Vitals   BP Heart Rate Heart Rate (via Pulse Ox) Resp Temp Temp src SpO2 O2 Device O2 Flow Rate   04/14/15 1324 04/14/15 1324 -- 04/14/15 1324 04/14/15 1324 04/14/15 1324 04/14/15 1324 04/14/15 1324 --   141/95 81  18 37.1 C (98.8 F) TEMPORAL 100 % None (Room air)       Weight           04/14/15 1324           99.8 kg (220 lb)                    Physical Exam   Constitutional: He is oriented to person, place, and time. He appears well-developed and well-nourished.   HENT:   Head: Normocephalic and atraumatic.   Mouth/Throat: Oropharynx is clear and moist. No oropharyngeal exudate.   Eyes: EOM are normal. Pupils are equal, round, and reactive to light. No scleral icterus.   Neck: Normal range of motion. Neck supple. No JVD present. No tracheal deviation present.   Cardiovascular: Normal rate, regular rhythm, normal heart sounds and intact distal pulses.    Pulmonary/Chest: Effort normal. No stridor. No respiratory distress. He has no wheezes. He exhibits no tenderness.   Abdominal: Soft. He exhibits no distension and no mass. There is no tenderness.   Musculoskeletal: Normal range of motion. He exhibits no edema or tenderness.   No cords   Neurological: He is alert and oriented to  person, place, and time.   Skin: Skin is warm and dry. He is not diaphoretic.   Psychiatric: He has a normal mood and affect. His behavior is normal. Thought content normal.       Medical Decision Making        Initial Evaluation:  ED First Provider Contact     Date/Time Event User Comments    04/14/15 1351 ED Provider First Contact Glennon MacWEBER, MARY ANN Initial Face to Face Provider Contact          Patient seen by me today 04/14/2015 at 1530    Assessment:  44 y.o.male comes to the ED with chest pain, SOB, diaphoresis    Differential Diagnosis includes ACS, doubt PE, PTX, PNA                      Plan: IV, labs, ECG, CXR, admit  Casidee Jann Jenness CornerA Ardyce Heyer, MD         Rochele Raringavis, Jakalyn Kratky A, MD  04/14/15 [phone]

## 2015-04-14 NOTE — Discharge Instructions (Signed)
Thank you Keene BreathMatthew Maes for coming to [facility] for your health care concerns.    [facility] as discussed.    In the event of an emergency please dial 911.

## 2015-04-14 NOTE — ED Notes (Signed)
04/14/15 1712   Observation Care   Observation care initiated  Yes   Patient has been verbally notified of their observation status Yes

## 2015-04-14 NOTE — ED Notes (Addendum)
Plan of Care     Regular diet  VS Q4hrs  Telemetry monitoring  Trop/EKG at 2010 and 0210  Pain management  Comfort measures as needed  Medication administration per order  CPAP overnight  Activity as tolerated

## 2015-04-14 NOTE — ED Notes (Signed)
Pt arrived to unit at approximately 1810. Pt complained of a mild chest pain of 2/10. Tylenol given for pain. Pt states having dizziness in vision, and correlates dizziness to"from the episodes of today". Pt also states feeling hungry and having discomfort in abdomen d/t not eating. Pt denies any nausea and shortness of breath. Telemetry placed. Pt updated on plan of care, oriented to unit and room. Call bell and bedside table within reach. Will continue to monitor.

## 2015-04-14 NOTE — ED Obs Notes (Signed)
ED OBSERVATION ADMISSION NOTE    Patient seen by me today, 04/14/2015 at 6:32 PM    Current patient status: Observation    History     Chief Complaint   Patient presents with    Chest Pain     HPI Comments: 45 year old male with pmhx OSA, hx lung nodules (reportedly stable), coronary calcifications on CT chest (for the lung nodules reportedly), hyperlipidemia.    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  The patient had tingling down his bilateral arms; the patient denies ever having something like this before; but on further questioning he has been worked up in [place] including cardiac Stress test end of 12/2014 which was normal.  The patient was to fly back home tomorrow morning, but went to urgent care who sent him to here for cardiac evaluation.  The patient had a cardiology appointment in [place] tomorrow; which he will not be making now.  Patient symptoms resolved other than hunger at this point (has not eaten all day), denies lower extremity edema, no cough.    The patient placed into observation for further evaluation and management.      History provided by:  Patient and medical records  Language interpreter used: No        Past Medical History:   Diagnosis Date    Cardiac complaint     has cardiology appointment in [facility] [place] 04/15/15 for "hardening of his ventricles"     Coronary artery disease     Diarrhea     High cholesterol     Lung nodules        Past Surgical History:   Procedure Laterality Date    BACK SURGERY  2011    COLONOSCOPY      lipoma removal         Family History   Problem Relation Age of Onset    Cancer Mother     Stroke Paternal Grandmother     Heart Disease Paternal Grandfather        Social History      reports that he has  never smoked. His smokeless tobacco use includes Snuff. He reports that he drinks about 2.4 oz of alcohol per week  He reports that he does not use illicit drugs. His sexual activity history is not on file.    Living Situation     Questions Responses    Patient lives with Family    Homeless No    Caregiver for other family member No    External Services None    Employment Employed    Domestic Violence Risk No          Review of Systems   Review of Systems   Constitutional: Positive for diaphoresis. Negative for activity change and fever.   HENT: Negative for congestion.    Eyes: Negative for visual disturbance.   Respiratory: Positive for chest tightness and shortness of breath.    Cardiovascular: Positive for chest pain and palpitations.   Gastrointestinal: Negative for abdominal distention, abdominal pain, constipation, diarrhea, nausea and vomiting.   Endocrine: Negative for polyuria.   Genitourinary: Negative for dysuria.   Musculoskeletal: Negative for arthralgias and back pain.   Skin: Negative for color change.   Allergic/Immunologic: Negative  for immunocompromised state.   Neurological: Positive for dizziness. Negative for seizures and light-headedness.   Hematological: Negative for adenopathy.   Psychiatric/Behavioral: Negative for agitation and behavioral problems.       Physical Exam     Visit Vitals    BP 122/88 (BP Location: Right arm)    Pulse 88    Temp 36.1 C (97 F) (Temporal)    Resp 16    Ht 1.753 m ( )    Wt 99.8 kg (220 lb)    SpO2 98%    BMI 32.49 kg/m2       Physical Exam   Constitutional: He is oriented to person, place, and time. He appears well-developed and well-nourished. No distress.   HENT:   Head: Normocephalic and atraumatic.   Eyes: EOM are normal. Pupils are equal, round, and reactive to light.   Neck: Normal range of motion.   Cardiovascular: Normal rate, regular rhythm and normal heart sounds.  Exam reveals no gallop and no friction rub.    No murmur  heard.  Pulmonary/Chest: Effort normal and breath sounds normal. No respiratory distress. He has no wheezes. He has no rales.   Abdominal: Soft. Bowel sounds are normal. He exhibits no distension. There is no tenderness.   Musculoskeletal: Normal range of motion. He exhibits no edema or deformity.   Neurological: He is alert and oriented to person, place, and time. No cranial nerve deficit.   Skin: Skin is warm and dry. He is not diaphoretic. No erythema.   Psychiatric: He has a normal mood and affect. His behavior is normal.   Nursing note and vitals reviewed.      Tests    EKG:no acute findings    Labs:   All labs in the last 24 hours:   Recent Results (from the past 24 hour(s))   CBC and differential    Collection Time: 04/14/15  2:10 PM   Result Value Ref Range    WBC 9.2 (H) 4.2 - 9.1 THOU/uL    RBC 4.8 4.6 - 6.1 MIL/uL    Hemoglobin 15.1 13.7 - 17.5 g/dL    Hematocrit 42 40 - 51 %    MCV 88 79 - 92 fL    MCH 31 26 - 32 pg/cell    MCHC 36 32 - 37 g/dL    RDW 16.1 09.6 - 04.5 %    Platelets 329 150 - 330 THOU/uL    Seg Neut % 75.3 %    Lymphocyte % 17.4 %    Monocyte % 6.2 %    Eosinophil % 0.4 %    Basophil % 0.2 %    Neut # K/uL 6.9 (H) 1.8 - 5.4 THOU/uL    Lymph # K/uL 1.6 1.3 - 3.6 THOU/uL    Mono # K/uL 0.6 0.3 - 0.8 THOU/uL    Eos # K/uL 0.0 0.0 - 0.5 THOU/uL    Baso # K/uL 0.0 0.0 - 0.1 THOU/uL    Nucl RBC % 0.0 0.0 - 0.2 /100 WBC    Nucl RBC # K/uL 0.0 0.0 - 0.0 THOU/uL    IMM Granulocytes # 0.1 0.0 - 0.1 THOU/uL    IMM Granulocytes 0.5 %   Basic metabolic panel    Collection Time: 04/14/15  2:10 PM   Result Value Ref Range    Glucose 102 (H) 60 - 99 mg/dL    Sodium 409 811 - 914 mmol/L    Potassium 3.9 3.3 - 5.1 mmol/L  Chloride 100 96 - 108 mmol/L    CO2 22 20 - 28 mmol/L    Anion Gap 18 (H) 7 - 16    UN 13 6 - 20 mg/dL    Creatinine 9.140.83 7.820.67 - 1.17 mg/dL    GFR,Caucasian 956107 *    GFR,Black 124 *    Calcium 9.8 9.0 - 10.3 mg/dL   Troponin T    Collection Time: 04/14/15  2:10 PM   Result Value Ref  Range    Troponin T <0.01 0.00 - 0.02 ng/mL   CK isoenzymes    Collection Time: 04/14/15  2:10 PM   Result Value Ref Range    CK 180 (H) 46 - 171 U/L    Mass CKMB 1.3 0.0 - 10.4 ng/mL    Relative Index 0.7 0.0 - 5.0 %   Hold blue    Collection Time: 04/14/15  2:14 PM   Result Value Ref Range    Hold Blue HOLD TUBE         Imaging:cxr: nad    Medical Decision Making        Assessment:    45 year old male with pmhx OSA, hx lung nodules (reportedly stable), coronary calcifications on CT chest (for the lung nodules reportedly), hyperlipidemia.    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  The patient had tingling down his bilateral arms; the patient denies ever having something like this before; but on further questioning he has been worked up [place] including cardiac Stress test end of 12/2014 which was normal.  The patient was to fly back home tomorrow morning, but went to urgent care who sent him to here for cardiac evaluation.  The [place] had [place]; which he will not be making now.  Patient symptoms resolved other than hunger at this point (has not eaten all day), denies lower extremity edema, no cough.    The patient placed into observation for further evaluation and management.      Differential Diagnosis includes: cardiogenic versus non-cardiogenic chest pain such as (hypoglycemia,GI causes, psychiatric causes, musculoskeletal)  Total HEART Score : 3  HEART Risk Level: Low (0-3)             Plan:   Chest Pain r/o ACS:  Trend troponins x 3, telemetry  Low suspicion for PE (no LE edema, tachycardia resolved which would not be case with PE, no respiratory complaints at this time either)  Higher suspicion of hypoglycemic episode  or panic attack; patient states symptoms resolved within one hour; if this was cardiac the troponins would turn positive  Anticipate discharge to home to follow up with his physicians in the morning with three negative troponins    Patient reports he was told of coronary calcifications; but functional status is that patient was able to pass an exercise stress test about 3 months ago    Hyperlipidemia: c/w zocor  OSA: CPAP @ HS    Medically preferred DVT prophylaxis: None; early ambulation  Smoking Cessation: NA      Darcella GasmanJustin William Lenward Able, DO           Lavenia AtlasWeisenberger, Violet HillJustin William, [place]DO  04/14/15 [phone]

## 2015-04-14 NOTE — ED Notes (Signed)
Patient brought immediately back from triage prior to full registration due to chest pain and concern for patient's immediate condition.    Vital signs and EKG obtained immediately by Everett Graffale Doyle, LPN, entered in medical record after the fact as patient not registered in system for several minutes. ASA (324mg ) administered orally per Dr. Stann Mainlandapuano's instructions, patient placed on 2 liters [place] O2 and instructed to try and relax/ breathe through his nose.     Patient from [place]- he has his wallet, cell phone, coat and wedding ring with his as [facility]. I spoke to his wife, Rubye OaksCameron Ulysse at cell # [phone]. I provided her with the [facility]; she is making arrangements to meet her husband-either at [facility] or the hotel he is staying at tonight. She also has [facility] phone number if we can assist her in any way.     EMS arrived at approximately 1250 and performed exam/transition to transport monitor.

## 2015-04-14 NOTE — ED Notes (Signed)
ED RN INTERN ATTESTATION       I Angelita InglesKristen N Makenzie Weisner, RN (RN) reviewed the following charting information by the RN intern: Earl LitesAngela Rojas    Nursing Assessments  Medications  Plan of Care  Teaching   Notes    In the chart of Keene BreathMatthew Lotts (44 y.o. male) and attest to the charting being accurate.

## 2015-04-14 NOTE — Telephone Encounter (Signed)
See phone noted 04/07/15. We are trying amitriptyline first as dictated by patient's insurance. Cayetano with [facility] advised.

## 2015-04-15 ENCOUNTER — Ambulatory Visit: Payer: BLUE CROSS/BLUE SHIELD | Admitting: Cardiology

## 2015-04-15 LAB — EKG 12-LEAD
P: 59 degrees
P: 53 degrees
P: 14 degrees
QRS: 25 degrees
QRS: -7 degrees
QRS: -12 degrees
Rate: 99 {beats}/min
Rate: 80 {beats}/min
Rate: 78 {beats}/min
Severity: BORDERLINE
Severity: BORDERLINE
Severity: BORDERLINE
Severity: BORDERLINE
Severity: ABNORMAL
Severity: ABNORMAL
Statement: BORDERLINE
Statement: BORDERLINE
Statement: BORDERLINE
T: 4 degrees
T: -3 degrees
T: -4 degrees

## 2015-04-15 LAB — CBC
Hematocrit: 40 % (ref 40–51)
Hemoglobin: 14.4 g/dL (ref 13.7–17.5)
MCH: 32 pg/cell (ref 26–32)
MCHC: 36 g/dL (ref 32–37)
MCV: 90 fL (ref 79–92)
Platelets: 295 10*3/uL (ref 150–330)
RBC: 4.5 MIL/uL — ABNORMAL LOW (ref 4.6–6.1)
RDW: 12.7 % (ref 11.6–14.4)
WBC: 8.7 10*3/uL (ref 4.2–9.1)

## 2015-04-15 LAB — BASIC METABOLIC PANEL
Anion Gap: 15 (ref 7–16)
CO2: 24 mmol/L (ref 20–28)
Calcium: 9.3 mg/dL (ref 9.0–10.3)
Chloride: 101 mmol/L (ref 96–108)
Creatinine: 0.82 mg/dL (ref 0.67–1.17)
GFR,Black: 125 *
GFR,Caucasian: 108 *
Glucose: 117 mg/dL — ABNORMAL HIGH (ref 60–99)
Lab: 14 mg/dL (ref 6–20)
Potassium: 4.1 mmol/L (ref 3.3–5.1)
Sodium: 140 mmol/L (ref 133–145)

## 2015-04-15 LAB — TROPONIN T: Troponin T: <0.01 ng/mL (ref 0.00–0.02)

## 2015-04-15 NOTE — ED Notes (Signed)
ED RN INTERN ATTESTATION       I Darrell Roysanielle A [name], RN (RN) reviewed the following charting information by the RN intern Darrell Stevens:    Nursing Assessments  Medications  Plan of Care  Teaching   Notes    In the chart of Darrell Stevens (44 y.o. male) and attest to the charting being accurate.

## 2015-04-15 NOTE — ED Obs Notes (Signed)
ED OBSERVATION DISCHARGE NOTE    Patient seen by me today, 04/15/2015 at 9:04 AM.    Current patient status: Observation    Subjective:  Patient seen and examined this morning; feeling well and no recurrent episodes overnight.  Patient wife has arrived from [place].  Patient counseled that the specific cause of the event yesterday is not certain at this time; but that he was stable enough to return to [place] to follow up with his own providers.  Patient was counseled to follow the advice of his physicians and to be sure to reschedule his new patient cardiology appointment.  Patient stable/ready for discharge to home with no new medication changes.    Observation Stay Includes:  45 y.o.male who presented to the ED with   Chief Complaint   Patient presents with    Chest Pain       Last Nursing documented pain:  0-10 Scale: 0 (04/15/15 0726)      Vitals:  Patient Vitals for the past 24 hrs:   BP Temp Temp src Pulse Resp SpO2 Height Weight   04/15/15 0726 118/75 36.9 C (98.4 F) TEMPORAL 73 18 97 % - -   04/15/15 0213 113/64 36.6 C (97.9 F) TEMPORAL 78 18 98 % - -   04/14/15 2215 114/74 36 C (96.8 F) TEMPORAL 78 16 96 % - -   04/14/15 1722 122/88 36.1 C (97 F) TEMPORAL 88 16 98 % - -   04/14/15 1324 (!) 141/95 37.1 C (98.8 F) TEMPORAL 81 18 100 % 1.753 m ( ) 99.8 kg (220 lb)         Physical Exam:  Physical Exam   Constitutional: He is oriented to person, place, and time. He appears well-developed and well-nourished. No distress.   HENT:   Head: Normocephalic and atraumatic.   Eyes: EOM are normal. Pupils are equal, round, and reactive to light.   Neck: Normal range of motion.   Cardiovascular: Normal rate and regular rhythm.    Pulmonary/Chest: Effort normal and breath sounds normal. No respiratory distress. He has no wheezes. He has no rales. He exhibits no tenderness.   Abdominal: Soft. Bowel sounds are normal. He exhibits no distension. There is no tenderness.   Musculoskeletal: Normal  range of motion. He exhibits no edema or deformity.   Neurological: He is alert and oriented to person, place, and time. No cranial nerve deficit.   Skin: Skin is warm and dry. He is not diaphoretic. No erythema.   Psychiatric: He has a normal mood and affect. His behavior is normal.   Vitals reviewed.      EKG: unchanged from previous tracings  Labs:   All labs in the last 24 hours:   Recent Results (from the past 24 hour(s))   CBC and differential    Collection Time: 04/14/15  2:10 PM   Result Value Ref Range    WBC 9.2 (H) 4.2 - 9.1 THOU/uL    RBC 4.8 4.6 - 6.1 MIL/uL    Hemoglobin 15.1 13.7 - 17.5 g/dL    Hematocrit 42 40 - 51 %    MCV 88 79 - 92 fL    MCH 31 26 - 32 pg/cell    MCHC 36 32 - 37 g/dL    RDW 81.1 91.4 - 78.2 %    Platelets 329 150 - 330 THOU/uL    Seg Neut % 75.3 %    Lymphocyte % 17.4 %    Monocyte %  6.2 %    Eosinophil % 0.4 %    Basophil % 0.2 %    Neut # K/uL 6.9 (H) 1.8 - 5.4 THOU/uL    Lymph # K/uL 1.6 1.3 - 3.6 THOU/uL    Mono # K/uL 0.6 0.3 - 0.8 THOU/uL    Eos # K/uL 0.0 0.0 - 0.5 THOU/uL    Baso # K/uL 0.0 0.0 - 0.1 THOU/uL    Nucl RBC % 0.0 0.0 - 0.2 /100 WBC    Nucl RBC # K/uL 0.0 0.0 - 0.0 THOU/uL    IMM Granulocytes # 0.1 0.0 - 0.1 THOU/uL    IMM Granulocytes 0.5 %   Basic metabolic panel    Collection Time: 04/14/15  2:10 PM   Result Value Ref Range    Glucose 102 (H) 60 - 99 mg/dL    Sodium 161 096 - 045 mmol/L    Potassium 3.9 3.3 - 5.1 mmol/L    Chloride 100 96 - 108 mmol/L    CO2 22 20 - 28 mmol/L    Anion Gap 18 (H) 7 - 16    UN 13 6 - 20 mg/dL    Creatinine 4.09 8.11 - 1.17 mg/dL    GFR,Caucasian 914 *    GFR,Black 124 *    Calcium 9.8 9.0 - 10.3 mg/dL   Troponin T    Collection Time: 04/14/15  2:10 PM   Result Value Ref Range    Troponin T <0.01 0.00 - 0.02 ng/mL   CK isoenzymes    Collection Time: 04/14/15  2:10 PM   Result Value Ref Range    CK 180 (H) 46 - 171 U/L    Mass CKMB 1.3 0.0 - 10.4 ng/mL    Relative Index 0.7 0.0 - 5.0 %   Hold blue    Collection Time: 04/14/15   2:14 PM   Result Value Ref Range    Hold Blue HOLD TUBE    Troponin T    Collection Time: 04/14/15  8:59 PM   Result Value Ref Range    Troponin T <0.01 0.00 - 0.02 ng/mL   Troponin T    Collection Time: 04/15/15  2:29 AM   Result Value Ref Range    Troponin T <0.01 0.00 - 0.02 ng/mL   CBC    Collection Time: 04/15/15  2:29 AM   Result Value Ref Range    WBC 8.7 4.2 - 9.1 THOU/uL    RBC 4.5 (L) 4.6 - 6.1 MIL/uL    Hemoglobin 14.4 13.7 - 17.5 g/dL    Hematocrit 40 40 - 51 %    MCV 90 79 - 92 fL    MCH 32 26 - 32 pg/cell    MCHC 36 32 - 37 g/dL    RDW 78.2 95.6 - 21.3 %    Platelets 295 150 - 330 THOU/uL   Basic metabolic panel    Collection Time: 04/15/15  2:29 AM   Result Value Ref Range    Glucose 117 (H) 60 - 99 mg/dL    Sodium 086 578 - 469 mmol/L    Potassium 4.1 3.3 - 5.1 mmol/L    Chloride 101 96 - 108 mmol/L    CO2 24 20 - 28 mmol/L    Anion Gap 15 7 - 16    UN 14 6 - 20 mg/dL    Creatinine 6.29 5.28 - 1.17 mg/dL    GFR,Caucasian 413 *    GFR,Black 125 *  Calcium 9.3 9.0 - 10.3 mg/dL     Imaging findings: cxr: nad  Cardiac Testing:  Telemetry: sinus; artifact      Assessment: chest pain ACS ruled out    Plan:  Discharge to home self care; no medication changes    Disposition: Home  Follow-up:  with in 1 week. [place]; and to reschedule new patient cardiology appointment which would have been this afternoon  Smoking Cessation: NA    Diagnoses that have been ruled out:   None   Diagnoses that are still under consideration:   None   Final diagnoses:   Chest pain, unspecified type           Author: Darcella GasmanJustin William Charelle Petrakis, DO  Note created: 04/15/2015  at: 9:04 AM                               Darcella GasmanWeisenberger, Chrisopher Pustejovsky William, DO  04/15/15 [phone]

## 2015-04-15 NOTE — Discharge Instructions (Signed)
You were seen in the emergency department for chest pain and shortness of breath  You were evaluated for cardiac conditions and there are no acute conditions which caused the event yesterday  It will be important for you to reschedule your new patient appointment with your cardiologist [facility] [place]; as well as to call your PCP and notify them of the events [place]  There were no changes to your home medication regimen  Please go to the nearest emergency department if your symptoms return or worsen

## 2015-04-15 NOTE — Progress Notes (Signed)
Utilization Management    Level of Care Observation service as of the date 04/14/2015      Justina Bertini Lee Jenasia Dolinar, RN     Pager: [phone]

## 2015-04-25 ENCOUNTER — Ambulatory Visit: Payer: BLUE CROSS/BLUE SHIELD | Admitting: Cardiovascular Disease

## 2015-04-27 ENCOUNTER — Encounter: Payer: Self-pay | Admitting: Cardiology

## 2015-04-27 ENCOUNTER — Ambulatory Visit (INDEPENDENT_AMBULATORY_CARE_PROVIDER_SITE_OTHER): Payer: BLUE CROSS/BLUE SHIELD | Admitting: Cardiology

## 2015-04-27 VITALS — BP 120/82 | HR 89 | Ht 69.0 in | Wt 241.8 lb

## 2015-04-27 DIAGNOSIS — I2510 Atherosclerotic heart disease of native coronary artery without angina pectoris: Secondary | ICD-10-CM | POA: Diagnosis not present

## 2015-04-27 DIAGNOSIS — F17220 Nicotine dependence, chewing tobacco, uncomplicated: Secondary | ICD-10-CM

## 2015-04-27 DIAGNOSIS — K589 Irritable bowel syndrome without diarrhea: Secondary | ICD-10-CM | POA: Diagnosis not present

## 2015-04-27 DIAGNOSIS — R079 Chest pain, unspecified: Secondary | ICD-10-CM | POA: Diagnosis not present

## 2015-04-27 DIAGNOSIS — I2584 Coronary atherosclerosis due to calcified coronary lesion: Secondary | ICD-10-CM

## 2015-04-27 NOTE — Patient Instructions (Signed)
Medication Instructions:  The current medical regimen is effective;  continue present plan and medications.  Follow-Up: Follow up as needed with Dr Skains.  If you need a refill on your cardiac medications before your next appointment, please call your pharmacy.  Thank you for choosing  [facility]!!     

## 2015-04-27 NOTE — Progress Notes (Signed)
Cardiology Office Note    Date:  04/27/2015   ID:  John Knox, DOB 02/01/1970, MRN [id number]  PCP:  John Hilding, MD  Cardiologist:   John Furbish, MD     History of Present Illness:  John Knox is a 45 y.o. male here for the evaluation of atherosclerosis, advanced for age at the request of John Knox of gastroenterology.  Has a history of irritable bowel syndrome. He underwent a CT scan of his chest on 04/04/15 to follow-up pulmonary nodules in this demonstrated mild atherosclerosis of thoracic aorta, coronary arteries including the left anterior descending artery. He is not describing any significant symptoms such as chest pain, shortness of breath, syncope, fevers, chills, bleeding. EKG is unremarkable. He has not started simvastatin 20 mg a day.  He has had experiences with chest wall discomfort, tightness, prior treadmill test in December 2016 was reassuring, in fact on a job up in [place], he was feeling chest discomfort, shakes, tremulous, went to urgent care and was then transported to the hospital for overnight observation which was reassuring, normal troponin, normal EKG. The doctor there thought potentially low blood sugar was the cause.  He does admit that he gets nervous at times/anxious especially around the 2 children 68, 23 years old. His wife takes BuSpar which has helped and he was on this medication previously but stopped.  Fathers father died 68 MI.      Past Medical History  Diagnosis Date  . Hyperlipidemia   . OSA on CPAP   . Sleep apnea   . Tubular adenoma of colon     Past Surgical History  Procedure Laterality Date  . Cervical fusion  10/17/2009  . Hip surgery Left 2002    hemotoma after fall    Current Medications: Outpatient Prescriptions Prior to Visit  Medication Sig Dispense Refill  . dicyclomine (BENTYL) 20 MG tablet TAKE ONE TABLET BY MOUTH TWICE DAILY AS NEEDED FOR DIARRHEA  3  . Lactobacillus Rhamnosus, GG, (PROBIOTIC  COLIC PO) Take 1 tablet by mouth daily.     Marland Kitchen loperamide (IMODIUM A-D) 2 MG tablet Take 2 mg by mouth 4 (four) times daily as needed for diarrhea or loose stools.    Marland Kitchen amitriptyline (ELAVIL) 50 MG tablet Take 1 tablet (50 mg total) by mouth at bedtime. 30 tablet 0  . rifaximin (XIFAXAN) 550 MG TABS tablet Take 1 tablet (550 mg total) by mouth 3 (three) times daily. 42 tablet 0   No facility-administered medications prior to visit.    Simvastatin 20 mg a day-has not start  Allergies:   Review of patient's allergies indicates no known allergies.   Social History   Social History  . Marital Status: Married    Spouse Name: N/A  . Number of Children: N/A  . Years of Education: N/A   Social History Main Topics  . Smoking status: Former Smoker    Types: Cigarettes    Quit date: 06/27/2004  . Smokeless tobacco: Current User    Types: Snuff  . Alcohol Use: 3.6 oz/week    6 Cans of beer per week  . Drug Use: No  . Sexual Activity: Not Asked   Other Topics Concern  . None   Social History Narrative     Family History:  The patient's family history includes Cancer in his father; Colon cancer (age of onset: 64) in his father; Hypertension in his father. There is no history of Rectal cancer or Stomach cancer.  ROS:   Please see the history of present illness.    ROS All other systems reviewed and are negative.   PHYSICAL EXAM:   VS:  BP 120/82 mmHg  Pulse 89  Ht 5\' 9"  (1.753 m)  Wt 241 lb 12.8 oz (109.68 kg)  BMI 35.69 kg/m2   GEN: Well nourished, well developed, in no acute distress HEENT: normal Neck: no JVD, carotid bruits, or masses Cardiac: RRR; no murmurs, rubs, or gallops,no edema  Respiratory:  clear to auscultation bilaterally, normal work of breathing GI: soft, nontender, nondistended, + BS MS: no deformity or atrophy Skin: warm and dry, no rash, ruddy appearance Neuro:  Alert and Oriented x 3, Strength and sensation are intact Psych: euthymic mood, full  affect  Wt Readings from Last 3 Encounters:  04/27/15 241 lb 12.8 oz (109.68 kg)  04/04/15 219 lb 4 oz (99.451 kg)  08/31/13 216 lb (97.977 kg)      Studies/Labs Reviewed:   EKG:  EKG is ordered today.  The ekg ordered today demonstrates 04/27/15-sinus rhythm, 89, no other abnormalities.  Recent Labs: No results found for requested labs within last 365 days.   Lipid Panel No results found for: CHOL, TRIG, HDL, CHOLHDL, VLDL, LDLCALC, LDLDIRECT  Additional studies/ records that were reviewed today include:  EKG, prior CT scan personally viewed and demonstrated to patient, lab work previously reviewed.    ASSESSMENT:    1. Chest pain, unspecified chest pain type   2. Coronary artery calcification   3. 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 IBS (irritable bowel syndrome)      PLAN:  In order of problems listed above:  Atherosclerosis/coronary calcification -Quite subtle on CT scan in the mid LAD segment as well as thoracic aorta but regardless, he is had a stress test performed in December 2016 that was normal, low risk and a recent evaluation in [place] state that was low risk as well, normal troponin, normal EKG. -I encouraged him to start his simvastatin 20 mg once a day for prevention.  Atypical chest pain -Reassuring troponin in ER visit/overnight visit in [place]. -Treadmill test reassuring in December [id number]. -Certainly stress and anxiety could be playing a role. He showed me on his chest wall where his discomfort is and this could be subtle inflammatory changes at pectoralis insertion points. Overall no further cardiac testing warranted at this time.  Dipping tobacco use -Encouraged cessation. Very challenging. Prior smoker, quit in 2006.  Irritable bowel syndrome -Per John Knox  Medication Adjustments/Labs and Tests Ordered: Current medicines are reviewed at length with the patient today.  Concerns regarding medicines are outlined  above.  Medication changes, Labs and Tests ordered today are listed in the Patient Instructions below. Patient Instructions  Medication Instructions:  The current medical regimen is effective;  continue present plan and medications.  Follow-Up: Follow up as needed with Dr John Knox.  If you need a refill on your cardiac medications before your next appointment, please call your pharmacy.  Thank you for choosing [facility]!!          Signed, John Furbish, MD  04/27/2015 12:20 PM    [facility] [address], [place], [place]  [address] Phone: [phone]; Fax: [phone]

## 2015-06-26 IMAGING — CT CT CHEST W/ CM
2 of 4 series · 15 of 36 positions shown, 18 images · IV contrast (omnipaque)
Comparison: CT 07/07/2013

CLINICAL DATA: Evaluate pulmonary nodules seen on prior CT [DATE].

EXAM:
CT CHEST WITH CONTRAST
TECHNIQUE: Multidetector CT imaging of the chest was performed during
intravenous contrast administration.
CONTRAST:  80mL OMNIPAQUE IOHEXOL 300 MG/ML  SOLN

[Series 2: chest with st · axial · 0.78mm/px · z∈[+858,+1098]mm · 12 of 56 slices shown, 15 images]
[im 4/56  mediastinal]
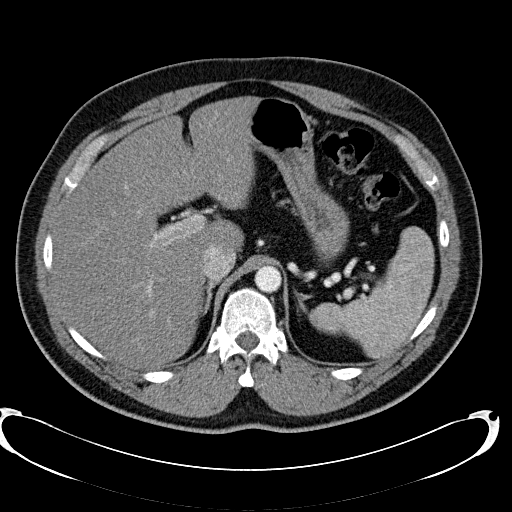
[im 4/56  lung]
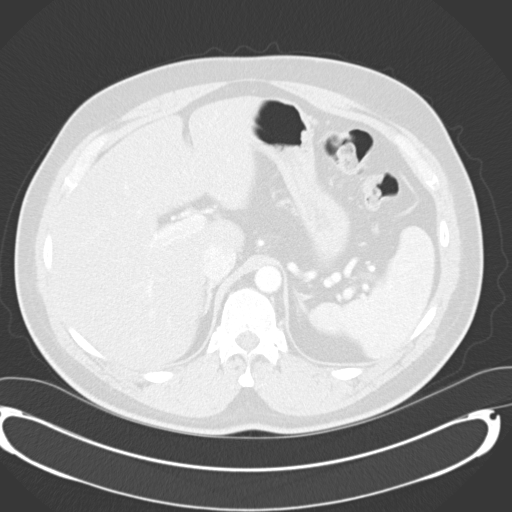
[im 8/56  lung]
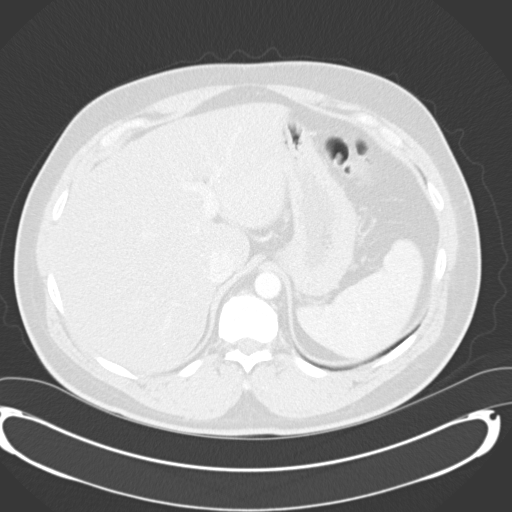
[im 12/56  lung]
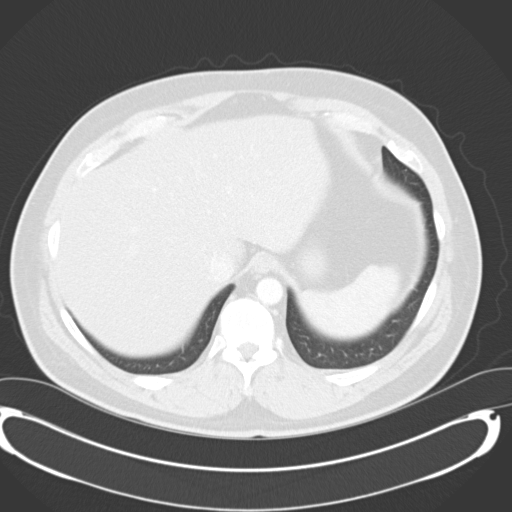
[im 16/56  lung]
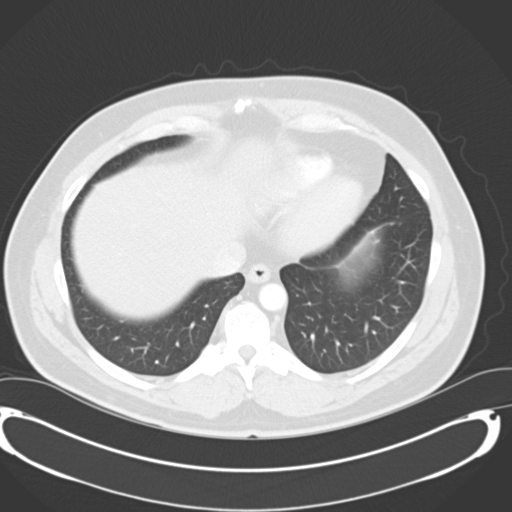
[im 20/56  mediastinal]
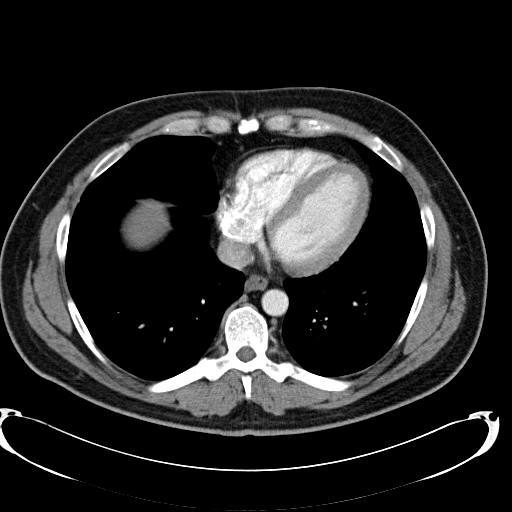
[im 20/56  lung]
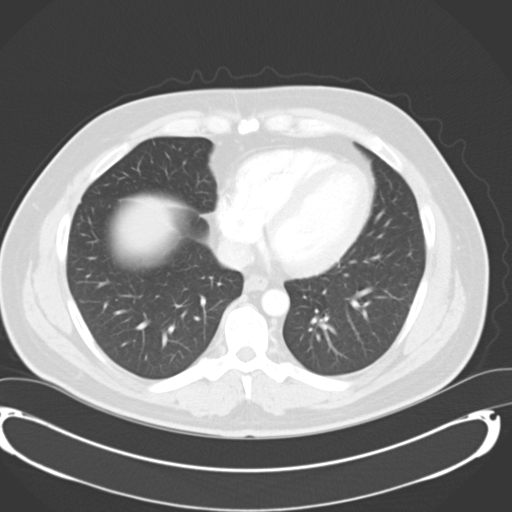
[im 24/56  lung]
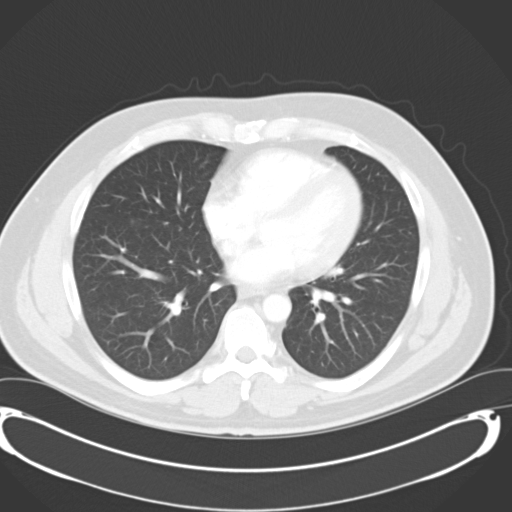
[im 32/56  lung]
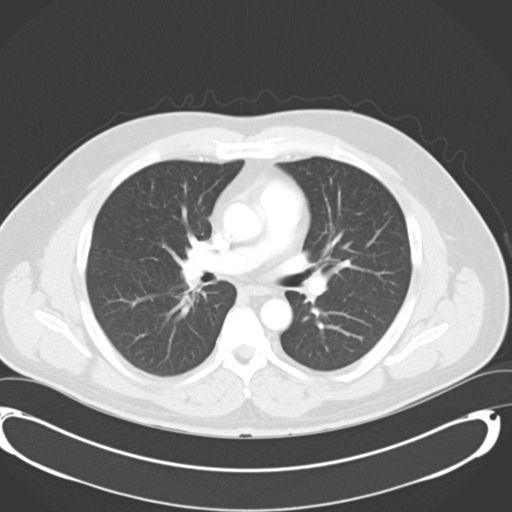
[im 36/56  lung]
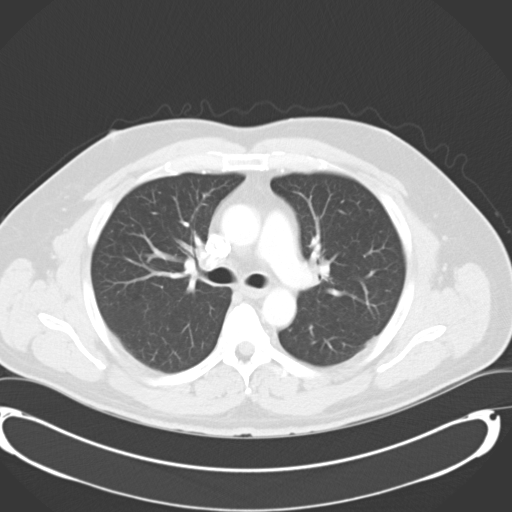
[im 40/56  mediastinal]
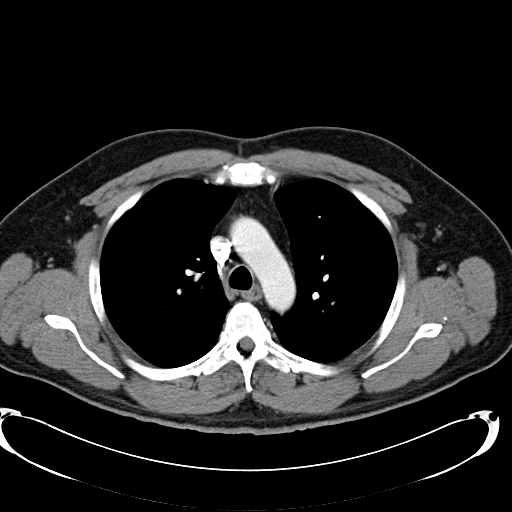
[im 40/56  lung]
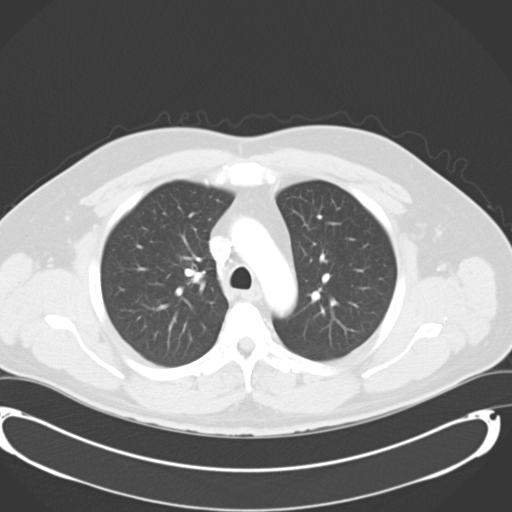
[im 44/56  lung]
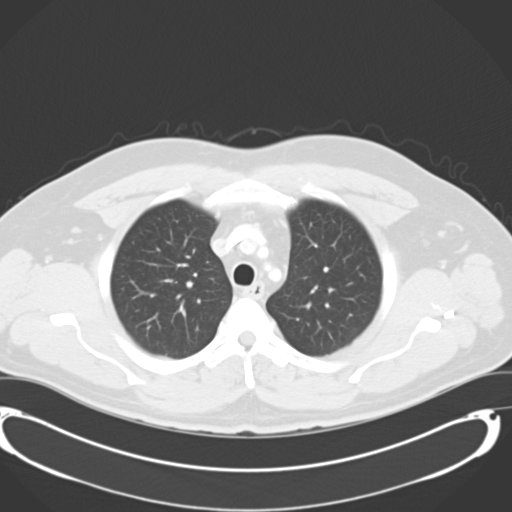
[im 48/56  lung]
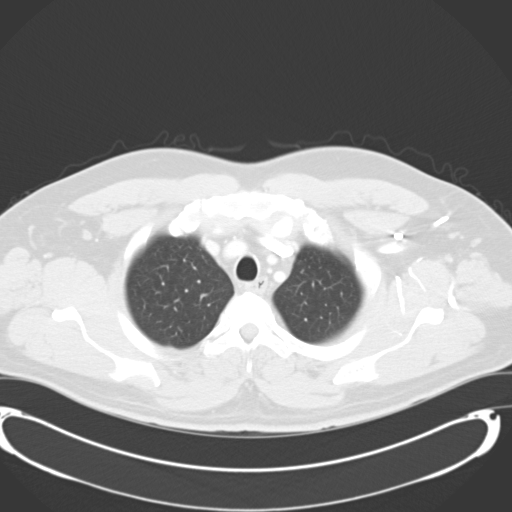
[im 52/56  lung]
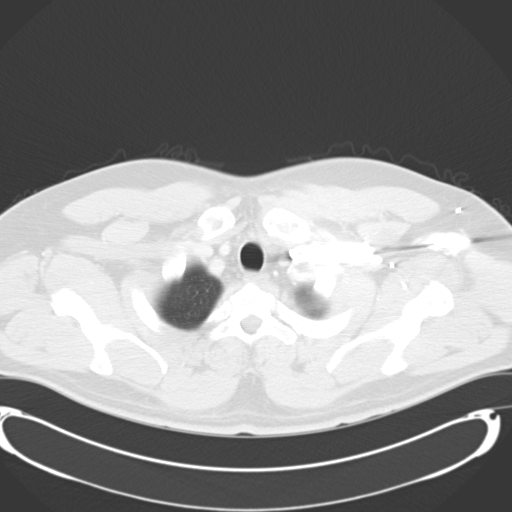

[Series 602: <mpr thick range> · coronal · 0.78mm/px · 3 of 81 slices shown]
[im 17/81  lung]
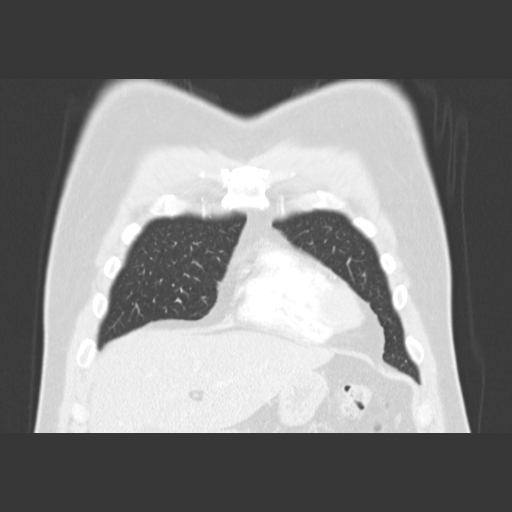
[im 33/81  lung]
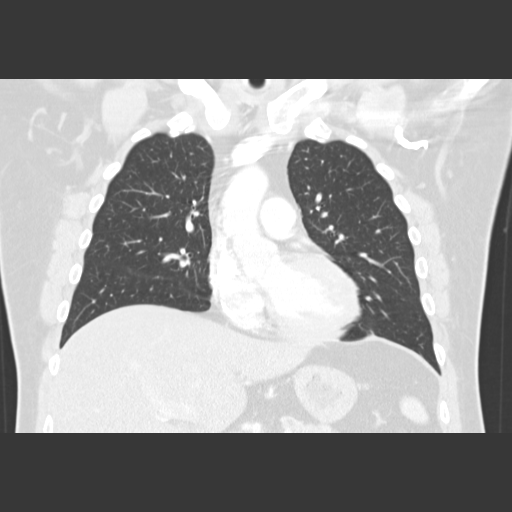
[im 49/81  lung]
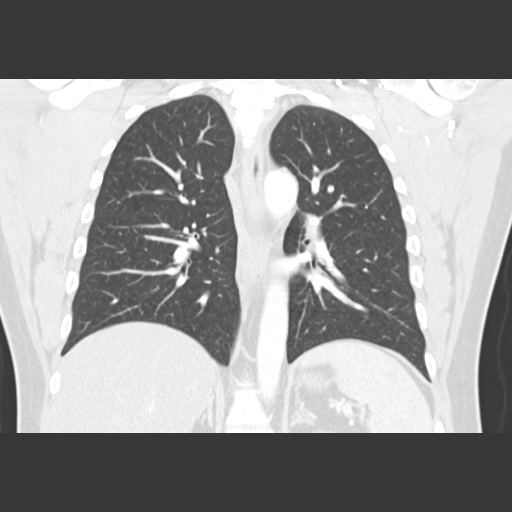

[15 of 36 positions shown; findings below may reference images not displayed]

FINDINGS: Visualized thyroid is unremarkable. No enlarged axillary,
mediastinal or hilar lymphadenopathy. Normal heart size. No
pericardial effusion. Normal caliber aorta and main pulmonary
artery.

Central airways are patent. Stable 6 mm subpleural nodule within the
right lower lobe (image 31; series 5). Stable 3 mm right middle lobe
nodule (image 36; series 5); 5 mm left lower lobe nodule (image 44;
series 5) ; 4 mm subpleural left lower lobe nodule (image 42; series
5); 4 mm left lobe nodule (image 31; series 5). 3 mm right upper
lobe nodule (image 15; series 5), not included on prior study. There
are additional scattered predominantly peripheral 2-3 mm nodules
within the left upper and left lower lobes. No pleural effusion or
pneumothorax.

Visualization of the upper abdomen demonstrates the liver to be
diffusely low in attenuation compatible with hepatic steatosis
spleen is unremarkable. Visualized adrenal glands are unremarkable.
No aggressive or acute appearing osseous lesions.
IMPRESSION: Grossly stable pulmonary nodules as above, largest of which measure
6 mm. An additional followup chest CT in 12 months is recommended.
This recommendation follows the consensus statement: Guidelines for
Management of Small Pulmonary Nodules Detected on CT Scans: A
Statement from the [HOSPITAL] as published in Radiology
6992; [DATE].

Hepatic steatosis.

## 2015-11-21 ENCOUNTER — Ambulatory Visit (INDEPENDENT_AMBULATORY_CARE_PROVIDER_SITE_OTHER): Payer: BLUE CROSS/BLUE SHIELD | Admitting: Nurse Practitioner

## 2015-11-21 ENCOUNTER — Encounter: Payer: Self-pay | Admitting: Nurse Practitioner

## 2015-11-21 VITALS — BP 130/90 | HR 93 | Ht 69.0 in | Wt 215.0 lb

## 2015-11-21 DIAGNOSIS — K580 Irritable bowel syndrome with diarrhea: Secondary | ICD-10-CM | POA: Diagnosis not present

## 2015-11-21 MED ORDER — DIPHENOXYLATE-ATROPINE 2.5-0.025 MG PO TABS: 1.0000 | ORAL_TABLET | Freq: Two times a day (BID) | ORAL | 2 refills | Status: DC

## 2015-11-21 MED ORDER — DICYCLOMINE HCL 20 MG PO TABS: 40.0000 mg | ORAL_TABLET | Freq: Two times a day (BID) | ORAL | 3 refills | Status: DC

## 2015-11-21 NOTE — Patient Instructions (Signed)
If you are age 44 or older, your body mass index should be between 23-30. Your Body mass index is 31.75 kg/m. If this is out of the aforementioned range listed, please consider follow up with your Primary Care Provider.  If you are age 73 or younger, your body mass index should be between 19-25. Your Body mass index is 31.75 kg/m. If this is out of the aformentioned range listed, please consider follow up with your Primary Care Provider.   We have sent the following medications to your pharmacy for you to pick up at your convenience:  Bentyl 40mg . Take two tablets in the morning and two tablets at lunch. No more through out the day.  Lomotil  Take twice daily.  Take Immodium 2mg  tablets as needed. Do not take more than 16mg  total per day.  Follow up as needed.

## 2015-11-21 NOTE — Progress Notes (Addendum)
     HPI: Patient is a 45 year old male known to Dr. Hilarie Fredrickson for history of adenomatous colon polyps, [facility] of colon cancer, diverticulosis, and  diarrhea predominant IBS. Paient was last seen late May. Symptoms were better with antispasmodics and Imodium he but wanted to see if other medication could be more effective.  We recommended rifaximin 14 days but insurance apparently wanted me to try Amitriptyline instead. There was some confusion about this, patient didn't get the medication.    Patient made this appointment two weeks ago when he was having a flare in his chronic LLQ pain. No significant pain in two weeks. He is managing pain and diarrhea with dietary adjustments, bentyl and imodium. Patient takes 40mg  of Bentyl in am, 40mg  at lunch and may take a few more later in the day. He takes several Imodium a day and unsure if taking 2mg  or 4 mg tablets. Patient frequently at business meetings. He takes excessive measures to prevent unexpected abdominal pain, postprandial urgent diarrhea.   Past Medical History:  Diagnosis Date  . Hyperlipidemia   . OSA on CPAP   . Sleep apnea   . Tubular adenoma of colon     Patient's surgical history, family medical history, social history, medications and allergies were all reviewed in Epic    Physical Exam: BP 130/90   Pulse 93   Ht 5\' 9"  (1.753 m)   Wt 215 lb (97.5 kg)   BMI 31.75 kg/m   GENERAL: well developed white in NAD PSYCH: :Pleasant, cooperative, normal affect HEENT: Normocephalic, conjunctiva pink, mucous membranes moist, neck supple without masses CARDIAC:  RRR, no murmur heard, no peripheral edema PULM: Normal respiratory effort, lungs CTA bilaterally, no wheezing ABDOMEN:  soft,  nondistended, no obvious masses, no hepatomegaly,  normal bowel sounds. Mild LLQ tenderness SKIN:  turgor, no lesions seen Musculoskeletal: normal muscle tone,  normal strength NEURO: Alert and oriented x 3, no focal neurologic deficits  ASSESSMENT and  PLAN:  45 year old male with D-IBS.  There was a miscommunication over Amitrypylline and patient never did pick up the prescription which insurance apparently wanted him to try before approving xifaxan. Currently, no LLQ in 2 weeks and no significant loose stool with Bentyl and Imodium but he is taking high doses of each TO PREVENT symptoms. Often takes 100-120mg  of bentyl in addition to several imodium a day  (doesn't know if taking 2mg  or 4 mg tablets). He admits to dry mouth and hazy vision at times.   -Try Lomotil BID. Maybe this can help him reduce amount of imodium he is taking.  -Imodium 2mg  as needed up to maximum dose of 16mg  /day  -Decrease Bentyl to 40mg  at breakfast and 40mg  at lunch. If needs another dose at dinner he will take only 20mg  tablet.  -If he has breakthrough symptoms on above regimen then need to try the Amitriptyline and if no response, proceed with xifaxan. We still have Viberzi to consider as well.       Tye Savoy  11/21/2015, 9:29 AM   Addendum: Reviewed and agree with management. Jerene Bears, MD

## 2016-02-10 ENCOUNTER — Other Ambulatory Visit: Payer: Self-pay | Admitting: Nurse Practitioner

## 2016-03-16 NOTE — Telephone Encounter (Signed)
Patient is requesting refill. He states that he only has 3 pills left. Best # [phone]. Paula prescribed but Dr. Hilarie Fredrickson patient.

## 2016-03-16 NOTE — Telephone Encounter (Signed)
I tried calling patient to inform him that medication has been sent in but no answer and voicemail is full

## 2016-04-21 ENCOUNTER — Other Ambulatory Visit: Payer: Self-pay | Admitting: Nurse Practitioner

## 2016-05-25 ENCOUNTER — Other Ambulatory Visit: Payer: Self-pay | Admitting: Nurse Practitioner

## 2016-07-05 ENCOUNTER — Other Ambulatory Visit: Payer: Self-pay | Admitting: Nurse Practitioner

## 2016-07-06 NOTE — Telephone Encounter (Signed)
This was already completed yesterday evening.

## 2016-12-21 ENCOUNTER — Other Ambulatory Visit: Payer: Self-pay | Admitting: Nurse Practitioner

## 2016-12-26 ENCOUNTER — Other Ambulatory Visit: Payer: Self-pay | Admitting: Nurse Practitioner

## 2016-12-26 ENCOUNTER — Telehealth: Payer: Self-pay | Admitting: Nurse Practitioner

## 2016-12-26 ENCOUNTER — Other Ambulatory Visit: Payer: Self-pay | Admitting: *Deleted

## 2016-12-26 MED ORDER — DIPHENOXYLATE-ATROPINE 2.5-0.025 MG PO TABS: 1.0000 | ORAL_TABLET | Freq: Two times a day (BID) | ORAL | 2 refills | Status: DC

## 2016-12-28 LAB — EKG 12-LEAD
P: 49 deg
PR: 164 ms
QRS: 0 deg
QRSD: 90 ms
QT: 324 ms
QTc: 458 ms
Rate: 120 {beats}/min
T: -26 deg

## 2017-01-03 ENCOUNTER — Ambulatory Visit (INDEPENDENT_AMBULATORY_CARE_PROVIDER_SITE_OTHER): Payer: BLUE CROSS/BLUE SHIELD | Admitting: Otolaryngology

## 2017-01-03 DIAGNOSIS — R070 Pain in throat: Secondary | ICD-10-CM

## 2017-01-03 DIAGNOSIS — J310 Chronic rhinitis: Secondary | ICD-10-CM | POA: Diagnosis not present

## 2017-01-03 DIAGNOSIS — K219 Gastro-esophageal reflux disease without esophagitis: Secondary | ICD-10-CM

## 2017-01-03 DIAGNOSIS — H9313 Tinnitus, bilateral: Secondary | ICD-10-CM

## 2017-01-03 DIAGNOSIS — H903 Sensorineural hearing loss, bilateral: Secondary | ICD-10-CM

## 2017-01-28 ENCOUNTER — Encounter: Payer: Self-pay | Admitting: *Deleted

## 2017-02-05 ENCOUNTER — Ambulatory Visit: Payer: BLUE CROSS/BLUE SHIELD | Admitting: Internal Medicine

## 2017-02-05 ENCOUNTER — Other Ambulatory Visit: Payer: Self-pay | Admitting: Internal Medicine

## 2017-02-06 ENCOUNTER — Telehealth: Payer: Self-pay | Admitting: *Deleted

## 2017-02-06 NOTE — Telephone Encounter (Signed)
Patient no showed appointment on 02/05/17. Dr Hilarie Fredrickson has reviewed records sent and writes, "Patient did not come for his office visit with me today. The barium esophagram is abnormal and he needs to see ENT ASAP to eval the irregularity in larynx. JMP" I have sent a letter to the patient's home address and have also sent letter to patient's referring provider.

## 2017-03-14 ENCOUNTER — Ambulatory Visit (INDEPENDENT_AMBULATORY_CARE_PROVIDER_SITE_OTHER): Payer: BLUE CROSS/BLUE SHIELD | Admitting: Otolaryngology

## 2018-08-09 ENCOUNTER — Encounter: Payer: Self-pay | Admitting: Internal Medicine

## 2019-09-24 ENCOUNTER — Ambulatory Visit (INDEPENDENT_AMBULATORY_CARE_PROVIDER_SITE_OTHER): Payer: Self-pay | Admitting: Orthopedic Surgery

## 2019-09-24 ENCOUNTER — Encounter: Payer: Self-pay | Admitting: Orthopedic Surgery

## 2019-09-24 ENCOUNTER — Other Ambulatory Visit: Payer: Self-pay

## 2019-09-24 VITALS — BP 148/106 | HR 83 | Ht 70.0 in | Wt 198.0 lb

## 2019-09-24 DIAGNOSIS — S43101A Unspecified dislocation of right acromioclavicular joint, initial encounter: Secondary | ICD-10-CM

## 2019-09-24 DIAGNOSIS — Y92322 Soccer field as the place of occurrence of the external cause: Secondary | ICD-10-CM

## 2019-09-24 DIAGNOSIS — W03XXXA Other fall on same level due to collision with another person, initial encounter: Secondary | ICD-10-CM

## 2019-09-24 NOTE — Progress Notes (Signed)
NEW PROBLEM//OFFICE VISIT  Chief Complaint  Patient presents with  . 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  Eventually went to the emergency room x-rays showed a type II AC separation he was placed in a sling which he wore for a week.  He took it off started moving his shoulder his pain and swelling have decreased although he still having some range of motion deficits and swelling over the right [facility] joint  The x-ray was taken at [facility] and we do not have the x-ray just the report ROS  No numbness or tingling in the right upper extremity no fever chills or skin changes Past Medical History:  Diagnosis Date  . Hyperlipidemia   . IBS (irritable bowel syndrome)   . OSA on CPAP   . Sleep apnea   . Tubular adenoma of colon     Past Surgical History:  Procedure Laterality Date  . CERVICAL FUSION  10/17/2009  . HIP SURGERY Left 2002   hemotoma after fall    Family History  Problem Relation Age of Onset  . Cancer Father   . Hypertension Father   . Colon cancer Father 76  . Rectal cancer Neg Hx   . Stomach cancer Neg Hx    Social History   Tobacco Use  . Smoking status: Former Smoker    Types: Cigarettes    Quit date: 06/27/2004    Years since quitting: 15.2  . Smokeless tobacco: Current User    Types: Snuff  Substance Use Topics  . Alcohol use: Yes    Alcohol/week: 6.0 standard drinks    Types: 6 Cans of beer per week  . Drug use: No    Allergies  Allergen Reactions  . Bee Venom Shortness Of Breath    No anaphylaxis No anaphylaxis     Current Meds  Medication Sig  . dicyclomine (BENTYL) 20 MG tablet TAKE TWO (2) TABLETS BY MOUTH EVERY MORNING AND TWO TABLETS AT LUNCH. TAKE NO MORE THROUGHOUT THE DAY.  . diphenoxylate-atropine (LOMOTIL) 2.5-0.025 MG  tablet Take 1 tablet by mouth 2 (two) times daily.  . Lactobacillus Rhamnosus, GG, (PROBIOTIC COLIC PO) Take 1 tablet by mouth daily.   Marland Kitchen loperamide (IMODIUM A-D) 2 MG tablet Take 2 mg by mouth 4 (four) times daily as needed for diarrhea or loose stools.  . simvastatin (ZOCOR) 20 MG tablet Take 20 mg by mouth daily.    BP (!) 148/106   Pulse 83   Ht 5\' 10"  (1.778 m)   Wt 198 lb (89.8 kg)   BMI 28.41 kg/m   Physical Exam Constitutional:      General: He is not in acute distress.    Appearance: He is well-developed.  Cardiovascular:     Comments: No peripheral edema Skin:    General: Skin is warm and dry.  Neurological:     Mental Status: He is alert and oriented to person, place, and time.     Sensory: No sensory deficit.     Coordination: Coordination normal.     Gait: Gait normal.     Deep Tendon Reflexes: Reflexes are normal and symmetric.     Ortho Exam  Right shoulder is tender over the [facility] joint and there is swelling.  The skin is normal.  Range of motion is 150 degrees of flexion with 90 degrees abduction this is somewhat painful he has pain reaching across his chest his strength and muscle tone are normal neurovascular exam is intact  MEDICAL DECISION MAKING  A. No diagnosis found.  B. DATA ANALYSED:   IMAGING: Interpretation of images: No images were available x-ray report shows mild elevation of the distal clavicle  The patient agrees that no further x-ray is necessary but a follow-up appointment in 2 weeks will be fine  Orders: NO  Outside records reviewed: [facility]   C. MANAGEMENT   Active range of motion to tolerance follow-up 2 weeks recheck motion  No orders of the defined types were placed in this encounter.     Arther Abbott, MD  09/24/2019 10:00 AMDirect blow landed right shoulder   Heard something   Felt pain

## 2019-11-25 ENCOUNTER — Encounter: Payer: Self-pay | Admitting: Cardiology

## 2019-12-09 ENCOUNTER — Encounter: Payer: Self-pay | Admitting: Cardiology

## 2019-12-30 ENCOUNTER — Encounter: Payer: Self-pay | Admitting: Diagnostic Radiology

## 2020-02-15 ENCOUNTER — Other Ambulatory Visit: Payer: Self-pay

## 2020-02-15 ENCOUNTER — Ambulatory Visit (AMBULATORY_SURGERY_CENTER): Payer: Self-pay

## 2020-02-15 VITALS — Ht 70.0 in | Wt 215.0 lb

## 2020-02-15 DIAGNOSIS — Z01818 Encounter for other preprocedural examination: Secondary | ICD-10-CM

## 2020-02-15 DIAGNOSIS — Z800 Family history of malignant neoplasm of digestive organs: Secondary | ICD-10-CM

## 2020-02-15 DIAGNOSIS — Z86010 Personal history of colonic polyps: Secondary | ICD-10-CM

## 2020-02-15 MED ORDER — SUTAB 1479-225-188 MG PO TABS
1.0000 | ORAL_TABLET | ORAL | 0 refills | Status: DC
Start: 2020-02-15 — End: 2020-03-01

## 2020-02-15 NOTE — Progress Notes (Signed)
No egg or soy allergy known to patient  No issues with past sedation with any surgeries or procedures No intubation problems in the past  No FH of Malignant Hyperthermia No diet pills per patient No home 02 use per patient  No blood thinners per patient  Pt denies issues with constipation  No A fib or A flutter  COVID 19 guidelines implemented in PV today with Pt and RN  COVID screening scheduled on 02/25/2020 at 8:30 am; patient aware of appt date/time; Coupon given to pt in PV today , Code to Pharmacy and  NO PA's for preps discussed with pt in PV today  Due to the COVID-19 pandemic we are asking patients to follow certain guidelines.  Pt aware of COVID protocols and [facility] guidelines

## 2020-02-17 ENCOUNTER — Telehealth: Payer: Self-pay | Admitting: Internal Medicine

## 2020-02-17 DIAGNOSIS — Z800 Family history of malignant neoplasm of digestive organs: Secondary | ICD-10-CM

## 2020-02-17 DIAGNOSIS — Z86010 Personal history of colonic polyps: Secondary | ICD-10-CM

## 2020-02-17 MED ORDER — PLENVU 140 G PO SOLR: 1.0000 | Freq: Once | ORAL | 0 refills | Status: AC

## 2020-02-17 NOTE — Telephone Encounter (Signed)
Spoke with the patient. He states he can not take all the pills and request a liquid prep. Plenvu explained and new prep instructions mail and RX sent to pharmacy with coupon attached to RX-pt aware and patient is aware of the cost.

## 2020-02-17 NOTE — Telephone Encounter (Signed)
Inbound call from patient requesting prep medication be changed to Moviprep if possible.  Please advise.

## 2020-02-26 ENCOUNTER — Encounter: Payer: Self-pay | Admitting: Internal Medicine

## 2020-02-29 ENCOUNTER — Telehealth: Payer: Self-pay | Admitting: Internal Medicine

## 2020-02-29 NOTE — Telephone Encounter (Signed)
Patient emailed a copy of his negative covid test on 02/27/2020 from his PCP. Dr.Pyrtle is off today. Dr.Jacob reviewed the results and verbal okay'ed the results so the patient does not have to retest. Copy of the covid test results given to [place] to place on pt's chart. Patient then called and notified. Pt aware he does not have to retest.

## 2020-02-29 NOTE — Telephone Encounter (Signed)
Inbound call from patient requesting a call back from a nurse please.  He missed his covid test for 02/26/2020, but he went to his PCP and received one there on 02/27/2020.  Wants to know if that will be ok.  Please advise.

## 2020-03-01 ENCOUNTER — Ambulatory Visit (AMBULATORY_SURGERY_CENTER): Payer: Commercial Managed Care - PPO | Admitting: Internal Medicine

## 2020-03-01 ENCOUNTER — Encounter: Payer: Self-pay | Admitting: Internal Medicine

## 2020-03-01 ENCOUNTER — Other Ambulatory Visit: Payer: Self-pay

## 2020-03-01 ENCOUNTER — Encounter: Payer: Self-pay | Admitting: *Deleted

## 2020-03-01 VITALS — BP 128/73 | HR 70 | Temp 97.8°F | Resp 29 | Ht 70.0 in | Wt 215.0 lb

## 2020-03-01 DIAGNOSIS — Z86010 Personal history of colonic polyps: Secondary | ICD-10-CM

## 2020-03-01 DIAGNOSIS — Z800 Family history of malignant neoplasm of digestive organs: Secondary | ICD-10-CM | POA: Diagnosis not present

## 2020-03-01 DIAGNOSIS — K635 Polyp of colon: Secondary | ICD-10-CM | POA: Diagnosis not present

## 2020-03-01 DIAGNOSIS — D125 Benign neoplasm of sigmoid colon: Secondary | ICD-10-CM

## 2020-03-01 MED ORDER — SODIUM CHLORIDE 0.9 % IV SOLN: 500.0000 mL | Freq: Once | INTRAVENOUS | Status: DC

## 2020-03-01 NOTE — Discharge Instructions (Signed)
Resume previous medications. Handouts on findings given to patient. Await pathology for final recommendations.  YOU HAD AN ENDOSCOPIC PROCEDURE TODAY AT THE [facility]:   Refer to the procedure report that was given to you for any specific questions about what was found during the examination.  If the procedure report does not answer your questions, please call your gastroenterologist to clarify.  If you requested that your care partner not be given the details of your procedure findings, then the procedure report has been included in a sealed envelope for you to review at your convenience later.  YOU SHOULD EXPECT: Some feelings of bloating in the abdomen. Passage of more gas than usual.  Walking can help get rid of the air that was put into your GI tract during the procedure and reduce the bloating. If you had a lower endoscopy (such as a colonoscopy or flexible sigmoidoscopy) you may notice spotting of blood in your stool or on the toilet paper. If you underwent a bowel prep for your procedure, you may not have a normal bowel movement for a few days.  Please Note:  You might notice some irritation and congestion in your nose or some drainage.  This is from the oxygen used during your procedure.  There is no need for concern and it should clear up in a day or so.  SYMPTOMS TO REPORT IMMEDIATELY:   Following lower endoscopy (colonoscopy or flexible sigmoidoscopy):  Excessive amounts of blood in the stool  Significant tenderness or worsening of abdominal pains  Swelling of the abdomen that is new, acute  Fever of 100F or higher  For urgent or emergent issues, a gastroenterologist can be reached at any hour by calling [phone]. Do not use MyChart messaging for urgent concerns.    DIET:  We do recommend a small meal at first, but then you may proceed to your regular diet.  Drink plenty of fluids but you should avoid alcoholic beverages for 24 hours.  ACTIVITY:  You should  plan to take it easy for the rest of today and you should NOT DRIVE or use heavy machinery until tomorrow (because of the sedation medicines used during the test).    FOLLOW UP: Our staff will call the number listed on your records 48-72 hours following your procedure to check on you and address any questions or concerns that you may have regarding the information given to you following your procedure. If we do not reach you, we will leave a message.  We will attempt to reach you two times.  During this call, we will ask if you have developed any symptoms of COVID 19. If you develop any symptoms (ie: fever, flu-like symptoms, shortness of breath, cough etc.) before then, please call [phone].  If you test positive for Covid 19 in the 2 weeks post procedure, please call and report this information to us.    If any biopsies were taken you will be contacted by phone or by letter within the next 1-3 weeks.  Please call us at [phone] if you have not heard about the biopsies in 3 weeks.    SIGNATURES/CONFIDENTIALITY: You and/or your care partner have signed paperwork which will be entered into your electronic medical record.  These signatures attest to the fact that that the information above on your After Visit Summary has been reviewed and is understood.  Full responsibility of the confidentiality of this discharge information lies with you and/or your care-partner. 

## 2020-03-01 NOTE — Op Note (Signed)
[facility] Patient Name: John Knox Procedure Date: 03/01/2020 2:41 PM MRN: [id number] Endoscopist: Jerene Bears , MD Age: 50 Referring MD:  Date of Birth: Nov 16, 1970 Gender: Male Account #: [id number] Procedure:                Colonoscopy Indications:              High risk colon cancer surveillance: Personal                            history of non-advanced adenoma, Family history of                            colon cancer in a first-degree relative (father)                            before age 55 years, Last colonoscopy: 2015 Medicines:                Monitored Anesthesia Care Procedure:                Pre-Anesthesia Assessment:                           - Prior to the procedure, a History and Physical                            was performed, and patient medications and                            allergies were reviewed. The patient's tolerance of                            previous anesthesia was also reviewed. The risks                            and benefits of the procedure and the sedation                            options and risks were discussed with the patient.                            All questions were answered, and informed consent                            was obtained. Prior Anticoagulants: The patient has                            taken no previous anticoagulant or antiplatelet                            agents. ASA Grade Assessment: II - A patient with                            mild systemic disease. After reviewing the risks  and benefits, the patient was deemed in                            satisfactory condition to undergo the procedure.                           After obtaining informed consent, the colonoscope                            was passed under direct vision. Throughout the                            procedure, the patient's blood pressure, pulse, and                            oxygen saturations were  monitored continuously. The                            Olympus [id number]) [id number] was introduced                            through the anus and advanced to the cecum,                            identified by appendiceal orifice and ileocecal                            valve. The colonoscopy was performed without                            difficulty. The patient tolerated the procedure                            well. The quality of the bowel preparation was                            excellent. The ileocecal valve, appendiceal                            orifice, and rectum were photographed. Scope In: 3:01:20 PM Scope Out: 3:10:16 PM Scope Withdrawal Time: 0 hours 6 minutes 32 seconds  Total Procedure Duration: 0 hours 8 minutes 56 seconds  Findings:                 The digital rectal exam was normal.                           A 3 mm polyp was found in the sigmoid colon. The                            polyp was sessile. The polyp was removed with a                            cold snare. Resection and retrieval were complete.  Multiple small-mouthed diverticula were found in                            the sigmoid colon.                           Internal hemorrhoids were found during                            retroflexion. The hemorrhoids were medium-sized. Complications:            No immediate complications. Estimated Blood Loss:     Estimated blood loss was minimal. Impression:               - One 3 mm polyp in the sigmoid colon, removed with                            a cold snare. Resected and retrieved.                           - Diverticulosis in the sigmoid colon.                           - Internal hemorrhoids. Recommendation:           - Patient has a contact number available for                            emergencies. The signs and symptoms of potential                            delayed complications were discussed with the                             patient. Return to normal activities tomorrow.                            Written discharge instructions were provided to the                            patient.                           - Resume previous diet.                           - Continue present medications.                           - Await pathology results.                           - Repeat colonoscopy is recommended for                            surveillance in 5 years. The colonoscopy date will  be determined after pathology results from today's                            exam become available for review. Jerene Bears, MD 03/01/2020 3:15:31 PM This report has been signed electronically.

## 2020-03-01 NOTE — Progress Notes (Signed)
To PACU, VSS. Report to [email or web address] 

## 2020-03-01 NOTE — Progress Notes (Signed)
Called to room to assist during endoscopic procedure.  Patient ID and intended procedure confirmed with present staff. Received instructions for my participation in the procedure from the performing physician.  

## 2020-03-01 NOTE — Progress Notes (Signed)
VS by [facility]  Pt's states no medical or surgical changes since previsit or office visit.

## 2020-03-02 ENCOUNTER — Ambulatory Visit: Payer: Self-pay | Admitting: Cardiology

## 2020-03-02 ENCOUNTER — Encounter: Payer: Self-pay | Admitting: *Deleted

## 2020-03-02 NOTE — Progress Notes (Deleted)
Clinical Summary John Knox is a 50 y.o.male  1. Coronary atherosclerosis - noted by CT scan in 2016, stress test around that time was benign - brother recently died of MI at 13     Past Medical History:  Diagnosis Date  . Allergy    seasonal allergies  . GERD (gastroesophageal reflux disease)    diet controlled  . Hyperlipidemia    diet controlled  . Hypertension    unknown med name  . IBS (irritable bowel syndrome)   . OSA on CPAP   . Sleep apnea    uses CPAP  . Tubular adenoma of colon      Allergies  Allergen Reactions  . Bee Venom Shortness Of Breath    No anaphylaxis No anaphylaxis      Current Outpatient Medications  Medication Sig Dispense Refill  . colchicine 0.6 MG tablet Take 0.6 mg by mouth daily.    Marland Kitchen losartan (COZAAR) 25 MG tablet Take 25 mg by mouth daily.     Current Facility-Administered Medications  Medication Dose Route Frequency Provider Last Rate Last Admin  . 0.9 %  sodium chloride infusion  500 mL Intravenous Once John Knox, John Knox, John Knox         Past Surgical History:  Procedure Laterality Date  . CERVICAL FUSION  10/17/2008  . HIP SURGERY Left 2002   hemotoma after fall  . WISDOM TOOTH EXTRACTION       Allergies  Allergen Reactions  . Bee Venom Shortness Of Breath    No anaphylaxis No anaphylaxis       Family History  Problem Relation Age of Onset  . Hypertension Father   . Colon cancer Father 41  . Colon polyps Father 63  . Lung cancer Father 80       mets from colon  . Heart attack Brother 37       DOA  . Rectal cancer Neg Hx   . Stomach cancer Neg Hx   . Esophageal cancer Neg Hx      Social History [name] reports that he quit smoking about 15 years ago. His smoking use included cigarettes. His smokeless tobacco use includes snuff. [name] reports current alcohol use of about 6.0 standard drinks of alcohol per week.   Review of Systems CONSTITUTIONAL: No weight loss, fever, chills, weakness  or fatigue.  HEENT: Eyes: No visual loss, blurred vision, double vision or yellow sclerae.No hearing loss, sneezing, congestion, runny nose or sore throat.  SKIN: No rash or itching.  CARDIOVASCULAR:  RESPIRATORY: No shortness of breath, cough or sputum.  GASTROINTESTINAL: No anorexia, nausea, vomiting or diarrhea. No abdominal pain or blood.  GENITOURINARY: No burning on urination, no polyuria NEUROLOGICAL: No headache, dizziness, syncope, paralysis, ataxia, numbness or tingling in the extremities. No change in bowel or bladder control.  MUSCULOSKELETAL: No muscle, back pain, joint pain or stiffness.  LYMPHATICS: No enlarged nodes. No history of splenectomy.  PSYCHIATRIC: No history of depression or anxiety.  ENDOCRINOLOGIC: No reports of sweating, cold or heat intolerance. No polyuria or polydipsia.  Marland Kitchen   Physical Examination There were no vitals filed for this visit. There were no vitals filed for this visit.  Gen: resting comfortably, no acute distress HEENT: no scleral icterus, pupils equal round and reactive, no palptable cervical adenopathy,  CV Resp: Clear to auscultation bilaterally GI: abdomen is soft, non-tender, non-distended, normal bowel sounds, no hepatosplenomegaly MSK: extremities are warm, no edema.  Skin: warm, no rash Neuro:  no focal deficits Psych: appropriate affect   Diagnostic Studies     Assessment and Plan        John Knox, M.D., F.A.C.C.

## 2020-03-03 ENCOUNTER — Telehealth: Payer: Self-pay

## 2020-03-03 ENCOUNTER — Telehealth: Payer: Self-pay | Admitting: *Deleted

## 2020-03-03 NOTE — Telephone Encounter (Signed)
Left message on follow up call. 

## 2020-03-03 NOTE — Telephone Encounter (Signed)
2nd f/u call attempt.  LVM

## 2020-03-10 ENCOUNTER — Encounter: Payer: Self-pay | Admitting: Internal Medicine

## 2020-04-20 ENCOUNTER — Encounter: Payer: Self-pay | Admitting: *Deleted

## 2020-04-20 ENCOUNTER — Ambulatory Visit (INDEPENDENT_AMBULATORY_CARE_PROVIDER_SITE_OTHER): Payer: Commercial Managed Care - PPO | Admitting: Cardiology

## 2020-04-20 ENCOUNTER — Encounter: Payer: Self-pay | Admitting: Cardiology

## 2020-04-20 ENCOUNTER — Other Ambulatory Visit: Payer: Self-pay

## 2020-04-20 VITALS — BP 150/100 | HR 84 | Ht 69.0 in | Wt 210.0 lb

## 2020-04-20 DIAGNOSIS — I10 Essential (primary) hypertension: Secondary | ICD-10-CM | POA: Diagnosis not present

## 2020-04-20 DIAGNOSIS — R079 Chest pain, unspecified: Secondary | ICD-10-CM

## 2020-04-20 MED ORDER — LOSARTAN POTASSIUM 25 MG PO TABS: 25.0000 mg | ORAL_TABLET | Freq: Every day | ORAL | 6 refills | Status: DC

## 2020-04-20 NOTE — Patient Instructions (Addendum)
Medication Instructions:   Losartan refilled today.   Continue all current medications.   Labwork: none  Testing/Procedures:  Your physician has requested that you have an exercise tolerance test. For further information please visit [email or web address]. Please also follow instruction sheet, as given.  Office will contact with results via phone or letter.    Follow-Up: 6 months   Any Other Special Instructions Will Be Listed Below (If Applicable).  If you need a refill on your cardiac medications before your next appointment, please call your pharmacy.

## 2020-04-20 NOTE — Progress Notes (Signed)
Clinical Summary John Knox is a 50 y.o.male seen as a new patient for the following medical problems  Last seen by Dr Marlou Porch in 04/2015  1. Chest pain/SOB - 03/2015 CT scan showed atherosclerosis of aorta and coronary arteries - he was asymptomatic at that time.  - prior GXT 2016 was benign  - tightness midchest spreads out left and right. Can occur at rest or with exertion. Lasts just a few minutes.  - no fixed exertional - occasional SOB/DOE at rest or with exertion.   CAD risk factors: FH with brother recently dying from MI age 5. HTN, ?HL   2. Palpitations - occasional palpitations, 5-6 times a month just a few seconds - 1-2 cups of coffee, occasional sodas, limited tea, no energy drinks - drinks 4 days a week, 1-2 beers sometimes more over the weekend.    3. HTN - has not been taken meds        Past Medical History:  Diagnosis Date  . Allergy    seasonal allergies  . GERD (gastroesophageal reflux disease)    diet controlled  . Hyperlipidemia    diet controlled  . Hypertension    unknown med name  . IBS (irritable bowel syndrome)   . OSA on CPAP   . Sleep apnea    uses CPAP  . Tubular adenoma of colon      Allergies  Allergen Reactions  . Bee Venom Shortness Of Breath    No anaphylaxis No anaphylaxis      Current Outpatient Medications  Medication Sig Dispense Refill  . colchicine 0.6 MG tablet Take 0.6 mg by mouth daily.    Marland Kitchen losartan (COZAAR) 25 MG tablet Take 25 mg by mouth daily.     Current Facility-Administered Medications  Medication Dose Route Frequency Provider Last Rate Last Admin  . 0.9 %  sodium chloride infusion  500 mL Intravenous Once Pyrtle, Lajuan Lines, MD         Past Surgical History:  Procedure Laterality Date  . CERVICAL FUSION  10/17/2008  . HIP SURGERY Left 2002   hemotoma after fall  . WISDOM TOOTH EXTRACTION       Allergies  Allergen Reactions  . Bee Venom Shortness Of Breath    No anaphylaxis No  anaphylaxis       Family History  Problem Relation Age of Onset  . Hypertension Father   . Colon cancer Father 40  . Colon polyps Father 22  . Lung cancer Father 52       mets from colon  . Heart attack Brother 43       DOA  . Rectal cancer Neg Hx   . Stomach cancer Neg Hx   . Esophageal cancer Neg Hx      Social History John Knox reports that he quit smoking about 15 years ago. His smoking use included cigarettes. His smokeless tobacco use includes snuff. John Knox reports current alcohol use of about 6.0 standard drinks of alcohol per week.   Review of Systems CONSTITUTIONAL: No weight loss, fever, chills, weakness or fatigue.  HEENT: Eyes: No visual loss, blurred vision, double vision or yellow sclerae.No hearing loss, sneezing, congestion, runny nose or sore throat.  SKIN: No rash or itching.  CARDIOVASCULAR: per hpi RESPIRATORY:per hpi GASTROINTESTINAL: No anorexia, nausea, vomiting or diarrhea. No abdominal pain or blood.  GENITOURINARY: No burning on urination, no polyuria NEUROLOGICAL: No headache, dizziness, syncope, paralysis, ataxia, numbness or tingling in the extremities. No  change in bowel or bladder control.  MUSCULOSKELETAL: No muscle, back pain, joint pain or stiffness.  LYMPHATICS: No enlarged nodes. No history of splenectomy.  PSYCHIATRIC: No history of depression or anxiety.  ENDOCRINOLOGIC: No reports of sweating, cold or heat intolerance. No polyuria or polydipsia.  Marland Kitchen   Physical Examination Today's Vitals   04/20/20 1453  BP: (!) 150/100  Pulse: 84  SpO2: 95%  Weight: 210 lb (95.3 kg)  Height: 5\' 9"  (1.753 m)   Body mass index is 31.01 kg/m.  Gen: resting comfortably, no acute distress HEENT: no scleral icterus, pupils equal round and reactive, no palptable cervical adenopathy,  CV: RRR, no m/r/g, no jvd Resp: Clear to auscultation bilaterally GI: abdomen is soft, non-tender, non-distended, normal bowel sounds, no  hepatosplenomegaly MSK: extremities are warm, no edema.  Skin: warm, no rash Neuro:  no focal deficits Psych: appropriate affect   Assessment and Plan  1. Chest pain/SOB - somewhat unclear symptoms as to the etiology - given CAD risk factors and prior coronary atherosclerosis by CT scan will obtain a GXT - EKG today shows NSR, no ischemic changes  2. HTN - encouraged compliance with losartan   Request from pcp. Pending lipid panel and ASCVD score consider statin.    Addendum TC 219 TG 120 HDL 52 LDL 146 ASCVD 10 year risk 4.8%. LIkely an underestimate given coronary atherosclerosis on CT and strong family history which are not incorporated into the score. Would recommend starting lipitor 20mg  daily.    Arnoldo Lenis, M.D..

## 2020-04-21 ENCOUNTER — Telehealth: Payer: Self-pay | Admitting: *Deleted

## 2020-04-21 NOTE — Telephone Encounter (Signed)
-----   Message from Arnoldo Lenis, MD sent at 04/21/2020 10:13 AM EDT ----- Please let patient know we got his labs from pcp and cholesterol is high, would recommend starting lipitor 20mg  daily.    Zandra Abts MD

## 2020-04-25 MED ORDER — ATORVASTATIN CALCIUM 20 MG PO TABS: 20.0000 mg | ORAL_TABLET | Freq: Every day | ORAL | 1 refills | Status: DC

## 2020-04-25 NOTE — Telephone Encounter (Signed)
Pt voiced understanding - Medication sent to pharmacy ° °

## 2020-05-11 ENCOUNTER — Other Ambulatory Visit (HOSPITAL_COMMUNITY)
Admission: RE | Admit: 2020-05-11 | Discharge: 2020-05-11 | Disposition: A | Discharge status: Home / Self Care | Payer: Commercial Managed Care - PPO | Source: Ambulatory Visit | Attending: Cardiology | Admitting: Cardiology

## 2020-05-11 NOTE — Progress Notes (Signed)
Patient No showed for his Covid test today. Called patient and ask him to call [facility] Sauer tomorrow morning and reschedule his appointment then. His procedure is Friday.

## 2020-05-13 ENCOUNTER — Encounter (HOSPITAL_COMMUNITY): Payer: Commercial Managed Care - PPO

## 2020-06-20 ENCOUNTER — Telehealth: Payer: Self-pay | Admitting: Cardiology

## 2020-06-20 DIAGNOSIS — R079 Chest pain, unspecified: Secondary | ICD-10-CM

## 2020-06-20 NOTE — Telephone Encounter (Addendum)
Advised that exercise tolerance test has already been ordered by Dr. Harl Bowie. Advised that message would be sent to scheduler to reschedule. Verbalized understanding.

## 2020-06-20 NOTE — Telephone Encounter (Signed)
New message     Patient wants to have a myoview instead of exercise stress test, he was in [facility] hospital this morning , thought he was having a heart attack , his bp was 166/117, they did some testing and did not find anything. He has a family hx of heart attacks with fatality

## 2020-06-21 NOTE — Addendum Note (Signed)
Addended by: Merlene Laughter on: 06/21/2020 08:12 AM   Modules accepted: Orders

## 2020-06-21 NOTE — Telephone Encounter (Signed)
Due to previous order canceled-new order placed for GXT

## 2020-06-28 ENCOUNTER — Ambulatory Visit (HOSPITAL_COMMUNITY)
Admission: RE | Admit: 2020-06-28 | Discharge: 2020-06-28 | Disposition: A | Discharge status: Home / Self Care | Payer: Commercial Managed Care - PPO | Source: Ambulatory Visit | Attending: Internal Medicine | Admitting: Internal Medicine

## 2020-06-28 ENCOUNTER — Other Ambulatory Visit: Payer: Self-pay | Admitting: Cardiology

## 2020-06-28 ENCOUNTER — Other Ambulatory Visit: Payer: Self-pay

## 2020-06-28 DIAGNOSIS — R079 Chest pain, unspecified: Secondary | ICD-10-CM | POA: Diagnosis not present

## 2020-06-28 DIAGNOSIS — R0789 Other chest pain: Secondary | ICD-10-CM

## 2020-06-28 LAB — EXERCISE TOLERANCE TEST
Estimated workload: 14.9 METS
Exercise duration (min): 12 min
Exercise duration (sec): 55 s
MPHR: 172 {beats}/min
Peak HR: 181 {beats}/min
Percent HR: 105 %
Rest HR: 83 {beats}/min

## 2020-06-29 ENCOUNTER — Telehealth: Payer: Self-pay | Admitting: Cardiology

## 2020-06-29 NOTE — Telephone Encounter (Signed)
Patient informed. Copy sent to PCP °

## 2020-06-29 NOTE — Telephone Encounter (Signed)
PATIENT CALLED AGAIN WANTING TO GET STRESS TEST RESULTS TODAY

## 2020-06-29 NOTE — Telephone Encounter (Signed)
Patient would like a call back regarding his test results.

## 2020-06-29 NOTE — Telephone Encounter (Signed)
-----   Message from Arnoldo Lenis, MD sent at 06/29/2020 12:45 PM EDT ----- Normal stress test. No evidence of any blockages. Based on duration of exercise his heart is in excellent condition   Zandra Abts MD

## 2020-10-27 ENCOUNTER — Ambulatory Visit: Payer: Commercial Managed Care - PPO | Admitting: Cardiology

## 2020-10-27 NOTE — Progress Notes (Deleted)
Clinical Summary John Knox is a 50 y.o.male  1. Chest pain/SOB - 03/2015 CT scan showed atherosclerosis of aorta and coronary arteries - he was asymptomatic at that time.  - prior GXT 2016 was benign   - tightness midchest spreads out left and right. Can occur at rest or with exertion. Lasts just a few minutes.  - no fixed exertional - occasional SOB/DOE at rest or with exertion.    CAD risk factors: FH with brother recently dying from MI age 65. HTN, ?HL    GXT 14.9 METs, no ischemic changes. Duke score 13   2. Palpitations - occasional palpitations, 5-6 times a month just a few seconds - 1-2 cups of coffee, occasional sodas, limited tea, no energy drinks - drinks 4 days a week, 1-2 beers sometimes more over the weekend.      3. HTN - has not been taken meds   Past Medical History:  Diagnosis Date   Allergy    seasonal allergies   GERD (gastroesophageal reflux disease)    diet controlled   Hyperlipidemia    diet controlled   Hypertension    unknown med name   IBS (irritable bowel syndrome)    OSA on CPAP    Sleep apnea    uses CPAP   Tubular adenoma of colon      Allergies  Allergen Reactions   Bee Venom Shortness Of Breath    No anaphylaxis No anaphylaxis      Current Outpatient Medications  Medication Sig Dispense Refill   atorvastatin (LIPITOR) 20 MG tablet Take 1 tablet (20 mg total) by mouth daily. 90 tablet 1   colchicine 0.6 MG tablet Take 0.6 mg by mouth daily. (Patient not taking: Reported on 04/20/2020)     losartan (COZAAR) 25 MG tablet Take 1 tablet (25 mg total) by mouth daily. 30 tablet 6   No current facility-administered medications for this visit.     Past Surgical History:  Procedure Laterality Date   CERVICAL FUSION  10/17/2008   HIP SURGERY Left 2002   hemotoma after fall   WISDOM TOOTH EXTRACTION       Allergies  Allergen Reactions   Bee Venom Shortness Of Breath    No anaphylaxis No anaphylaxis        Family History  Problem Relation Age of Onset   Hypertension Father    Colon cancer Father 30   Colon polyps Father 51   Lung cancer Father 31       mets from colon   Heart attack Brother 69       DOA   Rectal cancer Neg Hx    Stomach cancer Neg Hx    Esophageal cancer Neg Hx      Social History John Knox reports that he quit smoking about 16 years ago. His smoking use included cigarettes. His smokeless tobacco use includes snuff. John Knox reports current alcohol use of about 6.0 standard drinks per week.   Review of Systems CONSTITUTIONAL: No weight loss, fever, chills, weakness or fatigue.  HEENT: Eyes: No visual loss, blurred vision, double vision or yellow sclerae.No hearing loss, sneezing, congestion, runny nose or sore throat.  SKIN: No rash or itching.  CARDIOVASCULAR:  RESPIRATORY: No shortness of breath, cough or sputum.  GASTROINTESTINAL: No anorexia, nausea, vomiting or diarrhea. No abdominal pain or blood.  GENITOURINARY: No burning on urination, no polyuria NEUROLOGICAL: No headache, dizziness, syncope, paralysis, ataxia, numbness or tingling in the extremities. No change  in bowel or bladder control.  MUSCULOSKELETAL: No muscle, back pain, joint pain or stiffness.  LYMPHATICS: No enlarged nodes. No history of splenectomy.  PSYCHIATRIC: No history of depression or anxiety.  ENDOCRINOLOGIC: No reports of sweating, cold or heat intolerance. No polyuria or polydipsia.  Marland Kitchen   Physical Examination There were no vitals filed for this visit. There were no vitals filed for this visit.  Gen: resting comfortably, no acute distress HEENT: no scleral icterus, pupils equal round and reactive, no palptable cervical adenopathy,  CV Resp: Clear to auscultation bilaterally GI: abdomen is soft, non-tender, non-distended, normal bowel sounds, no hepatosplenomegaly MSK: extremities are warm, no edema.  Skin: warm, no rash Neuro:  no focal deficits Psych:  appropriate affect   Diagnostic Studies     Assessment and Plan  1. Chest pain/SOB - somewhat unclear symptoms as to the etiology - given CAD risk factors and prior coronary atherosclerosis by CT scan will obtain a GXT - EKG today shows NSR, no ischemic changes   2. HTN - encouraged compliance with losartan     Request from pcp. Pending lipid panel and ASCVD score consider statin.      Addendum TC 219 TG 120 HDL 52 LDL 146 ASCVD 10 year risk 4.8%. LIkely an underestimate given coronary atherosclerosis on CT and strong family history which are not incorporated into the score. Would recommend starting lipitor 20mg  daily.       Arnoldo Lenis, M.D., F.A.C.C.

## 2022-02-07 ENCOUNTER — Telehealth: Payer: Self-pay | Admitting: Cardiology

## 2022-02-07 NOTE — Telephone Encounter (Signed)
FYI.  °Contacted patient regarding recall appointment, patient notified our office they did not wish to keep this appointment at this time.  Deleted recall from system. °

## 2022-02-19 ENCOUNTER — Other Ambulatory Visit: Payer: Self-pay | Admitting: Cardiology

## 2022-02-20 ENCOUNTER — Other Ambulatory Visit: Payer: Self-pay | Admitting: *Deleted

## 2022-02-20 MED ORDER — LOSARTAN POTASSIUM 25 MG PO TABS: 25.0000 mg | ORAL_TABLET | Freq: Every day | ORAL | 0 refills | Status: AC

## 2022-03-05 ENCOUNTER — Other Ambulatory Visit (HOSPITAL_BASED_OUTPATIENT_CLINIC_OR_DEPARTMENT_OTHER): Payer: Self-pay

## 2022-03-05 DIAGNOSIS — R0683 Snoring: Secondary | ICD-10-CM

## 2022-03-05 DIAGNOSIS — G4733 Obstructive sleep apnea (adult) (pediatric): Secondary | ICD-10-CM

## 2022-03-05 DIAGNOSIS — R5383 Other fatigue: Secondary | ICD-10-CM

## 2022-05-15 ENCOUNTER — Ambulatory Visit: Payer: Commercial Managed Care - PPO | Admitting: Nurse Practitioner

## 2023-03-27 ENCOUNTER — Telehealth: Payer: Self-pay | Admitting: Primary Care

## 2023-03-27 NOTE — Telephone Encounter (Signed)
 No call has been made to him?

## 2023-03-27 NOTE — Telephone Encounter (Signed)
 PT calling this morning stating he is returning Katie's (?) call. [phone]

## 2023-05-20 ENCOUNTER — Ambulatory Visit (INDEPENDENT_AMBULATORY_CARE_PROVIDER_SITE_OTHER): Admitting: Primary Care

## 2023-05-20 VITALS — BP 141/88 | HR 101 | Ht 69.0 in | Wt 218.8 lb

## 2023-05-20 DIAGNOSIS — G4730 Sleep apnea, unspecified: Secondary | ICD-10-CM

## 2023-05-20 DIAGNOSIS — G4733 Obstructive sleep apnea (adult) (pediatric): Secondary | ICD-10-CM | POA: Diagnosis not present

## 2023-05-20 DIAGNOSIS — Z9981 Dependence on supplemental oxygen: Secondary | ICD-10-CM | POA: Diagnosis not present

## 2023-05-20 DIAGNOSIS — Z87891 Personal history of nicotine dependence: Secondary | ICD-10-CM

## 2023-05-20 NOTE — Progress Notes (Signed)
 @Patient  ID: John Knox, male    DOB: [birth year], 53 y.o.   MRN: [id number]  No chief complaint on file.   Referring provider: Lauran Pollard, MD  HPI: 53 year old male, former smoker. PMH significant for HTN, left knee effusion, colon polyp.   Split night sleep study in December 2013 showed moderate OSA, diagnostic AHI 27 well controlled on CPAP at pressure 12cm h2   05/20/2023 Discussed the use of AI scribe software for clinical note transcription with the patient, who gave verbal consent to proceed.  History of Present Illness   John Knox is a 53 year old male with sleep apnea who presents for a sleep consult.  He has a history of sleep apnea diagnosed in 2008. A split night sleep study in 2013 revealed moderate sleep apnea with 27 apneic events per hour, and a CPAP pressure setting of 12 cm H2O was recommended. He has been using a CPAP machine since then, but the humidifier is no longer functioning, and the machine is over 67 years old.  He feels exhausted and experiences symptoms such as snoring, gasping for breath, and waking up multiple times during the night, even while using the CPAP. He wakes up at least eight times per night and notes that his lungs feel tight due to the dry, hot air from the malfunctioning humidifier. Without the CPAP, he feels his breathing  'chokes out'.  He typically goes to bed between 8 to 9 PM and does not take long to fall asleep. However, he experiences frequent awakenings throughout the night. 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 He sometimes naps during the day, experiencing heart racing and gasping for breath. He is unsure of the current pressure setting on his CPAP but recalls it was set to 12 cm H2O previously.     Epworth 5/24    Allergies  Allergen Reactions   Bee Venom Shortness Of Breath    No anaphylaxis No  anaphylaxis      There is no immunization history on file for this patient.  Past Medical History:  Diagnosis Date   Allergy    seasonal allergies   GERD (gastroesophageal reflux disease)    diet controlled   Hyperlipidemia    diet controlled   Hypertension    unknown med name   IBS (irritable bowel syndrome)    OSA on CPAP    Sleep apnea    uses CPAP   Tubular adenoma of colon     Tobacco History: Social History   Tobacco Use  Smoking Status Former   Current packs/day: 0.00   Types: Cigarettes   Quit date: 06/27/2004   Years since quitting: 18.9  Smokeless Tobacco Current   Types: Snuff   Ready to quit: Not Answered Counseling given: Not Answered   Outpatient Medications Prior to Visit  Medication Sig Dispense Refill   atorvastatin  (LIPITOR) 20 MG tablet TAKE ONE TABLET BY MOUTH DAILY 30 tablet 0   colchicine 0.6 MG tablet Take 0.6 mg by mouth daily. (Patient not taking: Reported on 04/20/2020)     losartan  (COZAAR ) 25 MG tablet Take 1 tablet (25 mg total) by mouth daily. 30 tablet 0   No facility-administered medications prior to visit.   Review of Systems  Review of Systems  Constitutional:  Positive for fatigue.  HENT: Negative.    Respiratory:  Positive for shortness of breath.   Musculoskeletal:  Positive for arthralgias.  Neurological:  Positive for headaches.  Psychiatric/Behavioral:  Positive for sleep disturbance.    Physical Exam  There were no vitals taken for this visit. Physical Exam Constitutional:      General: He is not in acute distress.    Appearance: Normal appearance. He is not ill-appearing.  HENT:     Head: Normocephalic and atraumatic.     Mouth/Throat:     Mouth: Mucous membranes are moist.     Pharynx: Oropharynx is clear.  Cardiovascular:     Rate and Rhythm: Normal rate and regular rhythm.  Pulmonary:     Effort: Pulmonary effort is normal.     Breath sounds: Normal breath sounds. No wheezing, rhonchi or rales.      Comments: CTA Skin:    General: Skin is warm and dry.  Neurological:     General: No focal deficit present.     Mental Status: He is alert and oriented to person, place, and time. Mental status is at baseline.  Psychiatric:        Mood and Affect: Mood normal.        Behavior: Behavior normal.        Thought Content: Thought content normal.        Judgment: Judgment normal.      Lab Results:  CBC    Component Value Date/Time   WBC 15.1 (H) 07/06/2013 1525   RBC 4.90 07/06/2013 1525   HGB 15.5 07/06/2013 1525   HCT 45.1 07/06/2013 1525   PLT 328.0 07/06/2013 1525   MCV 92.2 07/06/2013 1525   MCH 33.0 10/12/2009 1136   MCHC 34.3 07/06/2013 1525   RDW 13.7 07/06/2013 1525   LYMPHSABS 2.4 07/06/2013 1525   MONOABS 1.0 07/06/2013 1525   EOSABS 0.2 07/06/2013 1525   BASOSABS 0.1 07/06/2013 1525    BMET    Component Value Date/Time   NA 141 10/12/2009 1136   K 4.8 10/12/2009 1136   CL 107 10/12/2009 1136   CO2 28 10/12/2009 1136   GLUCOSE 103 (H) 10/12/2009 1136   BUN 9 10/12/2009 1136   CREATININE 0.90 10/12/2009 1136   CALCIUM  9.8 10/12/2009 1136   GFRNONAA >60 10/12/2009 1136   GFRAA  10/12/2009 1136    >60        The eGFR has been calculated using the MDRD equation. This calculation has not been validated in all clinical situations. eGFR's persistently <60 mL/min signify possible Chronic Kidney Disease.    BNP No results found for: "BNP"  ProBNP No results found for: "PROBNP"  Imaging: No results found.   Assessment & Plan:   No problem-specific Assessment & Plan notes found for this encounter.  1. Sleep apnea treated with continuous positive airway pressure (CPAP) (Primary) - Ambulatory Referral for DME  Assessment and Plan    Obstructive Sleep Apnea Diagnosed in 2008 with moderate severity per 2013 sleep study showing 27 apneic events per hour. Current CPAP machine is over 35 years old with a non-functional humidifier, causing poor sleep  quality, snoring, gasping, and nocturnal awakenings. He reports exhaustion and lung tightness, likely due to dry, hot air from CPAP. Current pressure setting is 12, but adjustment may be needed due to persistent symptoms despite CPAP use.  - Advise patient contact [facility] to obtain a download from the current CPAP machine. - Order a new CPAP machine auto settings 10-15cm h20 and adjust pressure settings if needed. -  FU in 3 months for compliance check    Antonio Baumgarten, NP 05/20/2023

## 2023-05-20 NOTE — Patient Instructions (Signed)
  VISIT SUMMARY: You came in today for a sleep consultation due to ongoing issues with your sleep apnea. You have been using a CPAP machine for over 10 years, but it is no longer functioning properly, especially the humidifier. This has led to poor sleep quality, snoring, gasping for breath, and frequent awakenings during the night. You also feel exhausted during the day and experience lung tightness due to the dry, hot air from the malfunctioning humidifier.  YOUR PLAN: -OBSTRUCTIVE SLEEP APNEA: Obstructive sleep apnea is a condition where your airway becomes blocked during sleep, causing breathing pauses and poor sleep quality. Your current CPAP machine is outdated and its humidifier is not working, which is contributing to your symptoms. We will contact [facility] to get a download from your current CPAP machine to avoid repeating a sleep study for insurance purposes. Additionally, we will order a new CPAP machine and adjust the pressure settings if needed.  INSTRUCTIONS: Please contact [facility] to obtain a download from your current CPAP machine. We will use this information to order a new CPAP machine and adjust the pressure settings if necessary.  Follow-up: 3 months with Beth NP for CPAP compliance after getting new machine/ can be virtual visit on Friday afternoon

## 2023-06-07 ENCOUNTER — Other Ambulatory Visit: Payer: Self-pay | Admitting: Primary Care

## 2023-06-07 DIAGNOSIS — G4730 Sleep apnea, unspecified: Secondary | ICD-10-CM

## 2023-06-07 NOTE — Progress Notes (Signed)
 Patient needs updated sleep testing to re-establish dx in order for [facility] to proceed with CPAP order

## 8386-09-09 DEATH — deceased
